# Patient Record
Sex: Male | Born: 1940
Health system: Southern US, Community
[De-identification: ages and names within clinical notes are randomized; demographics above are authoritative.]

## PROBLEM LIST (undated history)

## (undated) DIAGNOSIS — E785 Hyperlipidemia, unspecified: Secondary | ICD-10-CM

## (undated) DIAGNOSIS — IMO0002 Reserved for concepts with insufficient information to code with codable children: Secondary | ICD-10-CM

## (undated) DIAGNOSIS — C61 Malignant neoplasm of prostate: Secondary | ICD-10-CM

## (undated) DIAGNOSIS — I480 Paroxysmal atrial fibrillation: Secondary | ICD-10-CM

## (undated) DIAGNOSIS — R0789 Other chest pain: Secondary | ICD-10-CM

## (undated) DIAGNOSIS — R000 Tachycardia, unspecified: Secondary | ICD-10-CM

## (undated) DIAGNOSIS — R9430 Abnormal result of cardiovascular function study, unspecified: Secondary | ICD-10-CM

## (undated) HISTORY — DX: Tachycardia, unspecified: R00.0

## (undated) HISTORY — DX: Paroxysmal atrial fibrillation: I48.0

## (undated) HISTORY — PX: PROSTATE SURGERY: SHX751

## (undated) HISTORY — DX: Reserved for concepts with insufficient information to code with codable children: IMO0002

## (undated) HISTORY — DX: Malignant neoplasm of prostate: C61

## (undated) HISTORY — DX: Abnormal result of cardiovascular function study, unspecified: R94.30

## (undated) HISTORY — DX: Hyperlipidemia, unspecified: E78.5

## (undated) HISTORY — DX: Other chest pain: R07.89

---

## 2011-12-06 DIAGNOSIS — G252 Other specified forms of tremor: Secondary | ICD-10-CM | POA: Diagnosis not present

## 2011-12-06 DIAGNOSIS — G250 Essential tremor: Secondary | ICD-10-CM | POA: Diagnosis not present

## 2011-12-16 DIAGNOSIS — E7800 Pure hypercholesterolemia, unspecified: Secondary | ICD-10-CM | POA: Diagnosis not present

## 2011-12-22 DIAGNOSIS — N400 Enlarged prostate without lower urinary tract symptoms: Secondary | ICD-10-CM | POA: Diagnosis not present

## 2011-12-22 DIAGNOSIS — E669 Obesity, unspecified: Secondary | ICD-10-CM | POA: Diagnosis not present

## 2011-12-22 DIAGNOSIS — E7800 Pure hypercholesterolemia, unspecified: Secondary | ICD-10-CM | POA: Diagnosis not present

## 2011-12-22 DIAGNOSIS — C61 Malignant neoplasm of prostate: Secondary | ICD-10-CM | POA: Diagnosis not present

## 2012-03-05 DIAGNOSIS — Z9079 Acquired absence of other genital organ(s): Secondary | ICD-10-CM | POA: Diagnosis not present

## 2012-03-05 DIAGNOSIS — R32 Unspecified urinary incontinence: Secondary | ICD-10-CM | POA: Diagnosis not present

## 2012-03-05 DIAGNOSIS — N529 Male erectile dysfunction, unspecified: Secondary | ICD-10-CM | POA: Diagnosis not present

## 2012-03-05 DIAGNOSIS — Z09 Encounter for follow-up examination after completed treatment for conditions other than malignant neoplasm: Secondary | ICD-10-CM | POA: Diagnosis not present

## 2012-03-05 DIAGNOSIS — Z8546 Personal history of malignant neoplasm of prostate: Secondary | ICD-10-CM | POA: Diagnosis not present

## 2012-03-05 DIAGNOSIS — C61 Malignant neoplasm of prostate: Secondary | ICD-10-CM | POA: Diagnosis not present

## 2012-04-08 ENCOUNTER — Other Ambulatory Visit: Payer: Self-pay

## 2012-04-08 DIAGNOSIS — Z8249 Family history of ischemic heart disease and other diseases of the circulatory system: Secondary | ICD-10-CM | POA: Diagnosis not present

## 2012-04-08 DIAGNOSIS — E785 Hyperlipidemia, unspecified: Secondary | ICD-10-CM | POA: Diagnosis not present

## 2012-04-08 DIAGNOSIS — R072 Precordial pain: Secondary | ICD-10-CM | POA: Diagnosis not present

## 2012-04-08 DIAGNOSIS — Z7982 Long term (current) use of aspirin: Secondary | ICD-10-CM | POA: Diagnosis not present

## 2012-04-08 DIAGNOSIS — I4891 Unspecified atrial fibrillation: Secondary | ICD-10-CM | POA: Diagnosis not present

## 2012-04-08 DIAGNOSIS — R079 Chest pain, unspecified: Secondary | ICD-10-CM | POA: Diagnosis not present

## 2012-04-08 DIAGNOSIS — Z8546 Personal history of malignant neoplasm of prostate: Secondary | ICD-10-CM | POA: Diagnosis not present

## 2012-04-08 DIAGNOSIS — R0789 Other chest pain: Secondary | ICD-10-CM | POA: Diagnosis not present

## 2012-04-10 ENCOUNTER — Encounter: Payer: Self-pay | Admitting: Cardiology

## 2012-04-10 DIAGNOSIS — I4891 Unspecified atrial fibrillation: Secondary | ICD-10-CM

## 2012-04-10 DIAGNOSIS — R079 Chest pain, unspecified: Secondary | ICD-10-CM

## 2012-04-16 ENCOUNTER — Encounter: Payer: Self-pay | Admitting: Cardiology

## 2012-04-16 DIAGNOSIS — R079 Chest pain, unspecified: Secondary | ICD-10-CM

## 2012-04-20 DIAGNOSIS — E669 Obesity, unspecified: Secondary | ICD-10-CM | POA: Diagnosis not present

## 2012-04-20 DIAGNOSIS — E7800 Pure hypercholesterolemia, unspecified: Secondary | ICD-10-CM | POA: Diagnosis not present

## 2012-04-23 ENCOUNTER — Encounter: Payer: Self-pay | Admitting: *Deleted

## 2012-04-26 DIAGNOSIS — I4891 Unspecified atrial fibrillation: Secondary | ICD-10-CM | POA: Diagnosis not present

## 2012-05-23 ENCOUNTER — Encounter: Payer: Self-pay | Admitting: Cardiology

## 2012-06-05 ENCOUNTER — Encounter: Payer: Medicare Other | Admitting: Cardiology

## 2012-06-06 ENCOUNTER — Encounter: Payer: Self-pay | Admitting: Adult Health

## 2012-06-06 ENCOUNTER — Ambulatory Visit (INDEPENDENT_AMBULATORY_CARE_PROVIDER_SITE_OTHER): Payer: Medicare Other | Admitting: Adult Health

## 2012-06-06 VITALS — BP 128/84 | HR 65 | Ht 71.0 in | Wt 226.0 lb

## 2012-06-06 DIAGNOSIS — I4891 Unspecified atrial fibrillation: Secondary | ICD-10-CM | POA: Diagnosis not present

## 2012-06-06 DIAGNOSIS — Z8679 Personal history of other diseases of the circulatory system: Secondary | ICD-10-CM

## 2012-06-06 NOTE — Assessment & Plan Note (Signed)
He is doing well. Rate is well controlled. He is not on anticoagulation as he remains in NSR with diltiazem. He had not any recurrence of the rapid HR. He is complaining of fatigue on the diltiazem and wishes to stop it only using the low dose for "breakthrough" rapid HR. I will defer this to Dr. Andee Lineman as he is otherwise asymptomatic with the exception of the fatigue. He is advised to continue the medication until follow up.   Stress test was negative for ischemia on 04/16/2012.  Echo showed normal LVEF, with mild LVH EF of 60%.

## 2012-06-06 NOTE — Progress Notes (Signed)
   HPI: Mr. Noah Mccarthy is a very pleasant 71 y/o patient of Dr. Andee Lineman we are seeing on hospital follow-up. He was admitted to [facility] last month with complaints of chest tightness, shortness of breath. He presented to ER and was found to be in afib with RVR.  Notes from Dr. Andee Lineman are not available at the time of this patients office visit.  However, test results for stress echo and plain echo are available to review. He states he is feeling better, but is often very fatigued with the institution of the Cardizem. He has not had to take any extra doses of Cardizem  For breakthrough HR elevations. He remains active and is traveling to [place] where he has a home, frequently. He denies chest discomfort or DOE.   No Known Allergies  Current Outpatient Prescriptions  Medication Sig Dispense Refill  . aspirin 81 MG tablet Take 81 mg by mouth daily.      . Cholecalciferol (VITAMIN D3) 1000 UNITS CAPS Take 1,000 Units by mouth daily.      Marland Kitchen diltiazem (CARDIZEM) 30 MG tablet Take 30 mg by mouth as needed. Use no more than 2 pills in 24 hours for rapid heart rate.      . diltiazem (DILACOR XR) 120 MG 24 hr capsule Take 120 mg by mouth daily.      . Omega-3 Fatty Acids (FISH OIL) 1000 MG CAPS Take 3,000 mg by mouth daily.      . vitamin C (ASCORBIC ACID) 500 MG tablet Take 500 mg by mouth daily.      Marland Kitchen DISCONTD: DILTIAZEM HCL PO Take 1 capsule by mouth daily.        Past Medical History  Diagnosis Date  . Paroxysmal atrial fibrillation     resloved  . Hyperlipidemia   . Prostate cancer     Past Surgical History  Procedure Date  . Prostate surgery     For prostate cancer    AVW:UJWJXB of systems complete and found to be negative unless listed above PHYSICAL EXAM BP 128/84  Pulse 65  Ht 5\' 11"  (1.803 m)  Wt 226 lb (102.513 kg)  BMI 31.52 kg/m2  General: Well developed, well nourished, in no acute distress Head: Eyes PERRLA, No xanthomas.   Normal cephalic and atramatic  Lungs: Clear  bilaterally to auscultation and percussion. Heart: HRRR S1 S2, without MRG.  Pulses are 2+ & equal.            No carotid bruit. No JVD.  No abdominal bruits. No femoral bruits. Abdomen: Bowel sounds are positive, abdomen soft and non-tender without masses or                  Hernia's noted. Msk:  Back normal, normal gait. Normal strength and tone for age. Extremities: No clubbing, cyanosis or edema.  DP +1 Neuro: Alert and oriented X 3. Psych:  Good affect, responds appropriately  EKG:NSR rate of 65 bpm  ASSESSMENT AND PLAN

## 2012-06-06 NOTE — Patient Instructions (Addendum)
Your physician recommends that you continue on your current medications as directed. Please refer to the Current Medication list given to you today.  Your physician recommends that you schedule a follow-up appointment in: 6 weeks with Dr. Andee Lineman

## 2012-06-11 NOTE — Addendum Note (Signed)
Addended by: Reather Laurence A on: 06/11/2012 11:32 AM   Modules accepted: Orders

## 2012-06-29 ENCOUNTER — Other Ambulatory Visit: Payer: Self-pay | Admitting: Cardiology

## 2012-07-03 DIAGNOSIS — L03039 Cellulitis of unspecified toe: Secondary | ICD-10-CM | POA: Diagnosis not present

## 2012-07-24 DIAGNOSIS — L03039 Cellulitis of unspecified toe: Secondary | ICD-10-CM | POA: Diagnosis not present

## 2012-08-01 ENCOUNTER — Encounter: Payer: Self-pay | Admitting: Cardiology

## 2012-08-01 ENCOUNTER — Ambulatory Visit (INDEPENDENT_AMBULATORY_CARE_PROVIDER_SITE_OTHER): Payer: Medicare Other | Admitting: Cardiology

## 2012-08-01 VITALS — BP 112/77 | HR 92 | Ht 71.0 in | Wt 224.0 lb

## 2012-08-01 DIAGNOSIS — R0789 Other chest pain: Secondary | ICD-10-CM | POA: Insufficient documentation

## 2012-08-01 DIAGNOSIS — R9430 Abnormal result of cardiovascular function study, unspecified: Secondary | ICD-10-CM | POA: Insufficient documentation

## 2012-08-01 DIAGNOSIS — I4891 Unspecified atrial fibrillation: Secondary | ICD-10-CM | POA: Diagnosis not present

## 2012-08-01 DIAGNOSIS — E785 Hyperlipidemia, unspecified: Secondary | ICD-10-CM | POA: Insufficient documentation

## 2012-08-01 DIAGNOSIS — C61 Malignant neoplasm of prostate: Secondary | ICD-10-CM | POA: Insufficient documentation

## 2012-08-01 DIAGNOSIS — I480 Paroxysmal atrial fibrillation: Secondary | ICD-10-CM | POA: Insufficient documentation

## 2012-08-01 NOTE — Progress Notes (Signed)
HPI   The patient is seen today to followup atrial fibrillation. I have never seen this patient before. He was seen by our team at [facility] with rapid atrial fibrillation. After that he was seen in our [place] office. Plans were then made for followup back in our office. As part of today's evaluation I have reviewed the data from the [facility] hospital admission including consultation, discharge summary, 2-D echo, and stress echo reports.  The patient is stable. Initially he presented with some chest discomfort. He had rapid atrial fibrillation at that time. He could not feel his rapid heartbeat. He spontaneously converted to sinus rhythm. He did not require anticoagulation. His echo showed normal LV function. His stress echo revealed no definite ischemia. He was sent home on a low dose of long-acting diltiazem.  Since being at home the patient has not noticed any significant palpitations or chest pain. He thinks that the long-acting diltiazem makes him feel fatigued.  No Known Allergies  Current Outpatient Prescriptions  Medication Sig Dispense Refill  . aspirin 81 MG tablet Take 81 mg by mouth daily.      . Cholecalciferol (VITAMIN D3) 1000 UNITS CAPS Take 1,000 Units by mouth daily.      Marland Kitchen diltiazem (CARDIZEM) 30 MG tablet Take 30 mg by mouth as needed. Use no more than 2 pills in 24 hours for rapid heart rate.      . rosuvastatin (CRESTOR) 5 MG tablet Take 5 mg by mouth daily.      . vitamin C (ASCORBIC ACID) 500 MG tablet Take 500 mg by mouth daily.      Marland Kitchen DISCONTD: diltiazem (DILACOR XR) 120 MG 24 hr capsule Take 120 mg by mouth daily.        History   Social History  . Marital Status: Unknown    Spouse Name: N/A    Number of Children: N/A  . Years of Education: N/A   Occupational History  . Not on file.   Social History Main Topics  . Smoking status: Never Smoker   . Smokeless tobacco: Never Used  . Alcohol Use: Yes     1 beverage every night for dinner  .  Drug Use: No  . Sexually Active: Not on file   Other Topics Concern  . Not on file   Social History Narrative  . No narrative on file    Family History  Problem Relation Age of Onset  . Heart attack Brother 60    cause of death    Past Medical History  Diagnosis Date  . Paroxysmal atrial fibrillation     Hospitalization in June, 2013, spontaneous conversion to normal sinus rhythm, CHADS score is 0, therefore anticoagulation has not been recommended  . Hyperlipidemia   . Prostate cancer   . Ejection fraction     EF 60%, echo, June, 2013  . Chest tightness     With rapid atrial fibrillation, June, 2013, stress echo normal    Past Surgical History  Procedure Date  . Prostate surgery     For prostate cancer    ROS   Patient denies fever, chills, headache, sweats, rash, change in vision, change in hearing, chest pain, cough, nausea vomiting, urinary symptoms. All other systems are reviewed and are negative.  PHYSICAL EXAM  Patient is oriented to person time and place. Affect is normal. Lungs are clear. Respiratory effort is nonlabored. There is no jugulovenous distention. Cardiac exam reveals S1 and S2. There no clicks or  significant murmurs. The abdomen is soft. There is no peripheral edema. There no musculoskeletal deformities. There are no skin rashes.  Filed Vitals:   08/01/12 1342  BP: 112/77  Pulse: 92  Height: 5\' 11"  (1.803 m)  Weight: 224 lb (101.606 kg)     ASSESSMENT & PLAN

## 2012-08-01 NOTE — Patient Instructions (Addendum)
Your physician recommends that you schedule a follow-up appointment in: 3 months. Your physician has recommended you make the following change in your medication: Stop dilitazem 120 mg. All other medications will remain the same.

## 2012-08-01 NOTE — Assessment & Plan Note (Signed)
The patient has not had any recurrent chest tightness. This occurred in the setting of rapid atrial fibrillation. His stress study showed no sign of ischemia or scar. No further workup.

## 2012-08-01 NOTE — Assessment & Plan Note (Signed)
The patient has had one episode of documented atrial fibrillation. He does not require anticoagulation. Unfortunately he did not feel the rapid heartbeat. It may be difficult to know if he is having further atrial fib. He does not require anticoagulation at this time. He thinks that long-acting diltiazem makes him feel poorly. At this time there is no absolute reason to continue it. He will carry short acting diltiazem with him to use on a when necessary basis. If he has increased problems he will be in touch. I will see him back in 3 months to review review his overall status.

## 2012-08-06 DIAGNOSIS — E7800 Pure hypercholesterolemia, unspecified: Secondary | ICD-10-CM | POA: Diagnosis not present

## 2012-08-20 ENCOUNTER — Ambulatory Visit: Payer: Medicare Other | Admitting: Cardiology

## 2012-08-30 DIAGNOSIS — R5381 Other malaise: Secondary | ICD-10-CM | POA: Diagnosis not present

## 2012-08-30 DIAGNOSIS — I4891 Unspecified atrial fibrillation: Secondary | ICD-10-CM | POA: Diagnosis not present

## 2012-08-30 DIAGNOSIS — E669 Obesity, unspecified: Secondary | ICD-10-CM | POA: Diagnosis not present

## 2012-08-30 DIAGNOSIS — C61 Malignant neoplasm of prostate: Secondary | ICD-10-CM | POA: Diagnosis not present

## 2012-08-30 DIAGNOSIS — N400 Enlarged prostate without lower urinary tract symptoms: Secondary | ICD-10-CM | POA: Diagnosis not present

## 2012-08-30 DIAGNOSIS — E7800 Pure hypercholesterolemia, unspecified: Secondary | ICD-10-CM | POA: Diagnosis not present

## 2012-10-24 DIAGNOSIS — H35369 Drusen (degenerative) of macula, unspecified eye: Secondary | ICD-10-CM | POA: Diagnosis not present

## 2012-10-24 DIAGNOSIS — H53009 Unspecified amblyopia, unspecified eye: Secondary | ICD-10-CM | POA: Diagnosis not present

## 2012-10-24 DIAGNOSIS — H2510 Age-related nuclear cataract, unspecified eye: Secondary | ICD-10-CM | POA: Diagnosis not present

## 2012-10-24 DIAGNOSIS — H40019 Open angle with borderline findings, low risk, unspecified eye: Secondary | ICD-10-CM | POA: Diagnosis not present

## 2012-10-26 DIAGNOSIS — C61 Malignant neoplasm of prostate: Secondary | ICD-10-CM | POA: Diagnosis not present

## 2012-10-30 ENCOUNTER — Encounter: Payer: Self-pay | Admitting: Cardiology

## 2012-10-30 ENCOUNTER — Ambulatory Visit (INDEPENDENT_AMBULATORY_CARE_PROVIDER_SITE_OTHER): Payer: Medicare Other | Admitting: Cardiology

## 2012-10-30 VITALS — BP 116/79 | HR 111 | Ht 71.0 in | Wt 232.0 lb

## 2012-10-30 DIAGNOSIS — R000 Tachycardia, unspecified: Secondary | ICD-10-CM | POA: Insufficient documentation

## 2012-10-30 DIAGNOSIS — R0789 Other chest pain: Secondary | ICD-10-CM

## 2012-10-30 DIAGNOSIS — R0989 Other specified symptoms and signs involving the circulatory and respiratory systems: Secondary | ICD-10-CM

## 2012-10-30 DIAGNOSIS — I4891 Unspecified atrial fibrillation: Secondary | ICD-10-CM | POA: Diagnosis not present

## 2012-10-30 DIAGNOSIS — I498 Other specified cardiac arrhythmias: Secondary | ICD-10-CM | POA: Diagnosis not present

## 2012-10-30 DIAGNOSIS — I480 Paroxysmal atrial fibrillation: Secondary | ICD-10-CM

## 2012-10-30 NOTE — Assessment & Plan Note (Signed)
Patient has mild sinus tachycardia in the office today. In his last recent visit with another physician his heart rate was normal. He did work hard this morning although he did Heat lunch.. He drinks caffeine in the morning. I show to manage check his pulse. He'll check his pulse at random times over the next several days and call this in to [place]. I suspect that his resting heart rate will be fine. If he has persistent sinus tachycardia I will probably recommend low-dose beta-blockade. I may consider further workup. We know that his echo was normal in June, 2013.

## 2012-10-30 NOTE — Assessment & Plan Note (Signed)
There has been no clinical recurrence of paroxysmal atrial fibrillation. His chads score is low. No further workup.

## 2012-10-30 NOTE — Progress Notes (Signed)
   HPI  Patient is seen to followup an episode of atrial fibrillation. This happened on one occurrence. He is completely stable. He chopped wood this morning. He has no symptoms.  No Known Allergies  Current Outpatient Prescriptions  Medication Sig Dispense Refill  . aspirin 81 MG tablet Take 81 mg by mouth daily.      . Cholecalciferol (VITAMIN D3) 1000 UNITS CAPS Take 1,000 Units by mouth daily.      Marland Kitchen diltiazem (CARDIZEM) 30 MG tablet Take 30 mg by mouth as needed. Use no more than 2 pills in 24 hours for rapid heart rate.      . rosuvastatin (CRESTOR) 5 MG tablet Take 2.5 mg by mouth daily.       . vitamin C (ASCORBIC ACID) 500 MG tablet Take 500 mg by mouth daily.        History   Social History  . Marital Status: Unknown    Spouse Name: N/A    Number of Children: N/A  . Years of Education: N/A   Occupational History  . Not on file.   Social History Main Topics  . Smoking status: Never Smoker   . Smokeless tobacco: Never Used  . Alcohol Use: Yes     Comment: 1 beverage every night for dinner  . Drug Use: No  . Sexually Active: Not on file   Other Topics Concern  . Not on file   Social History Narrative  . No narrative on file    Family History  Problem Relation Age of Onset  . Heart attack Brother 60    cause of death    Past Medical History  Diagnosis Date  . Paroxysmal atrial fibrillation     Hospitalization in June, 2013, spontaneous conversion to normal sinus rhythm, CHADS score is 0, therefore anticoagulation has not been recommended  . Hyperlipidemia   . Prostate cancer   . Ejection fraction     EF 60%, echo, June, 2013  . Chest tightness     With rapid atrial fibrillation, June, 2013, stress echo normal    Past Surgical History  Procedure Date  . Prostate surgery     For prostate cancer    Patient Active Problem List  Diagnosis  . Hyperlipidemia  . Prostate cancer  . Ejection fraction  . Chest tightness  . Paroxysmal atrial  fibrillation    ROS   Patient denies fever, chills, headache, sweats, rash, change in vision, change in hearing, chest pain, cough, nausea vomiting, urinary symptoms. All other systems are reviewed and are negative.  PHYSICAL EXAM  Patient is oriented to person time and place. Affect is normal. There is no jugular venous distention. Lungs are clear. Respiratory effort is nonlabored. Cardiac exam reveals S1 and S2. There no clicks or significant murmurs. The abdomen is soft. There is no peripheral edema. The patient's heart rate is increased at rest at approximately 100.  Filed Vitals:   10/30/12 1317  BP: 116/79  Pulse: 111  Height: 5\' 11"  (1.803 m)  Weight: 232 lb (105.235 kg)   EKG is done today and reviewed by me. There is no significant change. There is mild sinus tachycardia.  ASSESSMENT & PLAN

## 2012-10-30 NOTE — Assessment & Plan Note (Signed)
There is been no recurrent chest pain. He had initially was rapid atrial fibrillation. No further workup.

## 2012-10-30 NOTE — Patient Instructions (Addendum)
Your physician recommends that you schedule a follow-up appointment in: 6 months. You will receive a reminder letter in the mail in about 4 months reminding you to call and schedule your appointment. If you don't receive this letter, please contact our office.  Your physician recommends that you continue on your current medications as directed. Please refer to the Current Medication list given to you today.  Please call our office with your pulse results next week.

## 2012-11-08 ENCOUNTER — Telehealth: Payer: Self-pay | Admitting: Cardiology

## 2012-11-08 NOTE — Telephone Encounter (Signed)
Mr. Guevara called stating that Dr. Myrtis Ser had requested that he call the office with report of his heart rate. States that it is back Normal at this time. He checked it today (12-26) at 9:00am HR was 64.

## 2013-01-03 DIAGNOSIS — E7800 Pure hypercholesterolemia, unspecified: Secondary | ICD-10-CM | POA: Diagnosis not present

## 2013-01-03 DIAGNOSIS — R5381 Other malaise: Secondary | ICD-10-CM | POA: Diagnosis not present

## 2013-01-09 DIAGNOSIS — N400 Enlarged prostate without lower urinary tract symptoms: Secondary | ICD-10-CM | POA: Diagnosis not present

## 2013-01-09 DIAGNOSIS — E7800 Pure hypercholesterolemia, unspecified: Secondary | ICD-10-CM | POA: Diagnosis not present

## 2013-01-09 DIAGNOSIS — E669 Obesity, unspecified: Secondary | ICD-10-CM | POA: Diagnosis not present

## 2013-01-09 DIAGNOSIS — I4891 Unspecified atrial fibrillation: Secondary | ICD-10-CM | POA: Diagnosis not present

## 2013-01-09 DIAGNOSIS — R5381 Other malaise: Secondary | ICD-10-CM | POA: Diagnosis not present

## 2013-01-09 DIAGNOSIS — C61 Malignant neoplasm of prostate: Secondary | ICD-10-CM | POA: Diagnosis not present

## 2013-03-01 DIAGNOSIS — C61 Malignant neoplasm of prostate: Secondary | ICD-10-CM | POA: Diagnosis not present

## 2013-04-30 ENCOUNTER — Encounter: Payer: Self-pay | Admitting: Cardiology

## 2013-04-30 ENCOUNTER — Ambulatory Visit (INDEPENDENT_AMBULATORY_CARE_PROVIDER_SITE_OTHER): Payer: Medicare Other | Admitting: Cardiology

## 2013-04-30 VITALS — BP 133/84 | HR 76 | Ht 71.0 in | Wt 222.8 lb

## 2013-04-30 DIAGNOSIS — I498 Other specified cardiac arrhythmias: Secondary | ICD-10-CM | POA: Diagnosis not present

## 2013-04-30 DIAGNOSIS — R0789 Other chest pain: Secondary | ICD-10-CM | POA: Diagnosis not present

## 2013-04-30 DIAGNOSIS — R000 Tachycardia, unspecified: Secondary | ICD-10-CM

## 2013-04-30 DIAGNOSIS — E785 Hyperlipidemia, unspecified: Secondary | ICD-10-CM | POA: Diagnosis not present

## 2013-04-30 DIAGNOSIS — I4891 Unspecified atrial fibrillation: Secondary | ICD-10-CM

## 2013-04-30 DIAGNOSIS — I480 Paroxysmal atrial fibrillation: Secondary | ICD-10-CM

## 2013-04-30 NOTE — Assessment & Plan Note (Signed)
Patient has had no return of symptomatic atrial fib. No further workup.

## 2013-04-30 NOTE — Assessment & Plan Note (Signed)
He takes low dose Crestor. We want to see if his fatigue is related to the statin. He will stop his Crestor completely for 2-3 weeks.

## 2013-04-30 NOTE — Assessment & Plan Note (Signed)
He's not having any symptoms. He had the chest tightness when he had rapid atrial fib on one occasion in June, 2013. No further workup is needed.

## 2013-04-30 NOTE — Assessment & Plan Note (Signed)
He is not having any obvious ongoing sinus tachycardia.  We'll see him back in one year.

## 2013-04-30 NOTE — Patient Instructions (Addendum)
Your physician recommends that you schedule a follow-up appointment in: 1 year. You will receive a reminder letter in the mail in about 10 months reminding you to call and schedule your appointment. If you don't receive this letter, please contact our office. Your physician has recommended you make the following change in your medication: HOLD CRESTOR FOR 2-3 WEEKS. PLEASE SEE IF YOUR SYMPTOMS RESOLVE. All other medications will remain the same.

## 2013-04-30 NOTE — Progress Notes (Signed)
HPI  Patient is seen to followup a single episode of atrial fibrillation and some mild sinus tachycardia in the past. He has not had any palpitations. When I saw him last in December, 2013 he was in sinus rhythm. I asked him to call us back after a few weeks to check with his heart rate was at home. He was in the normal range.  He has a problem with fatigue. We are wondering if there is any chance it could be from low dose Crestor.  No Known Allergies  Current Outpatient Prescriptions  Medication Sig Dispense Refill  . aspirin 81 MG tablet Take 81 mg by mouth daily.      . Cholecalciferol (VITAMIN D3) 1000 UNITS CAPS Take 1,000 Units by mouth daily.      Marland Kitchen diltiazem (CARDIZEM) 30 MG tablet Take 30 mg by mouth as needed. Use no more than 2 pills in 24 hours for rapid heart rate.      . rosuvastatin (CRESTOR) 5 MG tablet Take 2.5 mg by mouth every other day.       . vitamin C (ASCORBIC ACID) 500 MG tablet Take 500 mg by mouth daily.       No current facility-administered medications for this visit.    History   Social History  . Marital Status: Unknown    Spouse Name: N/A    Number of Children: N/A  . Years of Education: N/A   Occupational History  . Not on file.   Social History Main Topics  . Smoking status: Never Smoker   . Smokeless tobacco: Never Used  . Alcohol Use: Yes     Comment: 1 beverage every night for dinner  . Drug Use: No  . Sexually Active: Not on file   Other Topics Concern  . Not on file   Social History Narrative  . No narrative on file    Family History  Problem Relation Age of Onset  . Heart attack Brother 60    cause of death    Past Medical History  Diagnosis Date  . Paroxysmal atrial fibrillation     Hospitalization in June, 2013, spontaneous conversion to normal sinus rhythm, CHADS score is 0, therefore anticoagulation has not been recommended  . Hyperlipidemia   . Prostate cancer   . Ejection fraction     EF 60%, echo, June, 2013    . Chest tightness     With rapid atrial fibrillation, June, 2013, stress echo normal  . Sinus tachycardia     Mild, office , December, 2013, no symptoms    Past Surgical History  Procedure Laterality Date  . Prostate surgery      For prostate cancer    Patient Active Problem List   Diagnosis Date Noted  . Sinus tachycardia   . Hyperlipidemia   . Prostate cancer   . Ejection fraction   . Chest tightness   . Paroxysmal atrial fibrillation     ROS   Patient denies fever, chills, headache, sweats, rash, change in vision, change in hearing, chest pain, cough, nausea vomiting, urinary symptoms. All of the systems are reviewed and are negative.  PHYSICAL EXAM  Patient is oriented to person time and place. Affect is normal. There is no jugulovenous distention. Lungs are clear. Respiratory effort is nonlabored. Cardiac exam reveals S1 and S2. There no clicks or significant murmurs. The abdomen is soft. There is no peripheral edema.  Filed Vitals:   04/30/13 1316  BP: 133/84  Pulse:  76  Height: 5\' 11"  (1.803 m)  Weight: 222 lb 12.8 oz (101.061 kg)  SpO2: 96%   EKG is done today and reviewed by me. The heart rate is 81. There is normal sinus rhythm. There is no significant change.  ASSESSMENT & PLAN

## 2013-06-03 DIAGNOSIS — E785 Hyperlipidemia, unspecified: Secondary | ICD-10-CM | POA: Diagnosis not present

## 2013-06-03 DIAGNOSIS — D510 Vitamin B12 deficiency anemia due to intrinsic factor deficiency: Secondary | ICD-10-CM | POA: Diagnosis not present

## 2013-06-03 DIAGNOSIS — I4891 Unspecified atrial fibrillation: Secondary | ICD-10-CM | POA: Diagnosis not present

## 2013-06-03 DIAGNOSIS — R5381 Other malaise: Secondary | ICD-10-CM | POA: Diagnosis not present

## 2013-06-03 DIAGNOSIS — C61 Malignant neoplasm of prostate: Secondary | ICD-10-CM | POA: Diagnosis not present

## 2013-06-05 DIAGNOSIS — D510 Vitamin B12 deficiency anemia due to intrinsic factor deficiency: Secondary | ICD-10-CM | POA: Diagnosis not present

## 2013-06-06 DIAGNOSIS — D510 Vitamin B12 deficiency anemia due to intrinsic factor deficiency: Secondary | ICD-10-CM | POA: Diagnosis not present

## 2013-06-07 DIAGNOSIS — E538 Deficiency of other specified B group vitamins: Secondary | ICD-10-CM | POA: Diagnosis not present

## 2013-06-10 DIAGNOSIS — D510 Vitamin B12 deficiency anemia due to intrinsic factor deficiency: Secondary | ICD-10-CM | POA: Diagnosis not present

## 2013-06-11 DIAGNOSIS — D510 Vitamin B12 deficiency anemia due to intrinsic factor deficiency: Secondary | ICD-10-CM | POA: Diagnosis not present

## 2013-07-01 DIAGNOSIS — I4891 Unspecified atrial fibrillation: Secondary | ICD-10-CM | POA: Diagnosis not present

## 2013-07-01 DIAGNOSIS — E669 Obesity, unspecified: Secondary | ICD-10-CM | POA: Diagnosis not present

## 2013-07-01 DIAGNOSIS — E7800 Pure hypercholesterolemia, unspecified: Secondary | ICD-10-CM | POA: Diagnosis not present

## 2013-07-01 DIAGNOSIS — C61 Malignant neoplasm of prostate: Secondary | ICD-10-CM | POA: Diagnosis not present

## 2013-07-01 DIAGNOSIS — R5381 Other malaise: Secondary | ICD-10-CM | POA: Diagnosis not present

## 2013-07-02 DIAGNOSIS — R5381 Other malaise: Secondary | ICD-10-CM | POA: Diagnosis not present

## 2013-07-02 DIAGNOSIS — D510 Vitamin B12 deficiency anemia due to intrinsic factor deficiency: Secondary | ICD-10-CM | POA: Diagnosis not present

## 2013-07-02 DIAGNOSIS — I4891 Unspecified atrial fibrillation: Secondary | ICD-10-CM | POA: Diagnosis not present

## 2013-07-02 DIAGNOSIS — E7800 Pure hypercholesterolemia, unspecified: Secondary | ICD-10-CM | POA: Diagnosis not present

## 2013-10-29 DIAGNOSIS — C61 Malignant neoplasm of prostate: Secondary | ICD-10-CM | POA: Diagnosis not present

## 2013-11-18 DIAGNOSIS — H2510 Age-related nuclear cataract, unspecified eye: Secondary | ICD-10-CM | POA: Diagnosis not present

## 2013-11-18 DIAGNOSIS — H53009 Unspecified amblyopia, unspecified eye: Secondary | ICD-10-CM | POA: Diagnosis not present

## 2013-11-18 DIAGNOSIS — H40019 Open angle with borderline findings, low risk, unspecified eye: Secondary | ICD-10-CM | POA: Diagnosis not present

## 2013-12-26 DIAGNOSIS — C61 Malignant neoplasm of prostate: Secondary | ICD-10-CM | POA: Diagnosis not present

## 2013-12-26 DIAGNOSIS — N400 Enlarged prostate without lower urinary tract symptoms: Secondary | ICD-10-CM | POA: Diagnosis not present

## 2013-12-26 DIAGNOSIS — R5381 Other malaise: Secondary | ICD-10-CM | POA: Diagnosis not present

## 2013-12-26 DIAGNOSIS — R5383 Other fatigue: Secondary | ICD-10-CM | POA: Diagnosis not present

## 2013-12-26 DIAGNOSIS — E7800 Pure hypercholesterolemia, unspecified: Secondary | ICD-10-CM | POA: Diagnosis not present

## 2013-12-26 DIAGNOSIS — E669 Obesity, unspecified: Secondary | ICD-10-CM | POA: Diagnosis not present

## 2013-12-26 DIAGNOSIS — I4891 Unspecified atrial fibrillation: Secondary | ICD-10-CM | POA: Diagnosis not present

## 2013-12-26 DIAGNOSIS — E538 Deficiency of other specified B group vitamins: Secondary | ICD-10-CM | POA: Diagnosis not present

## 2014-01-01 DIAGNOSIS — E669 Obesity, unspecified: Secondary | ICD-10-CM | POA: Diagnosis not present

## 2014-01-01 DIAGNOSIS — R5381 Other malaise: Secondary | ICD-10-CM | POA: Diagnosis not present

## 2014-01-01 DIAGNOSIS — M545 Low back pain, unspecified: Secondary | ICD-10-CM | POA: Diagnosis not present

## 2014-01-01 DIAGNOSIS — R5383 Other fatigue: Secondary | ICD-10-CM | POA: Diagnosis not present

## 2014-01-01 DIAGNOSIS — I4891 Unspecified atrial fibrillation: Secondary | ICD-10-CM | POA: Diagnosis not present

## 2014-01-01 DIAGNOSIS — E7800 Pure hypercholesterolemia, unspecified: Secondary | ICD-10-CM | POA: Diagnosis not present

## 2014-02-28 DIAGNOSIS — C61 Malignant neoplasm of prostate: Secondary | ICD-10-CM | POA: Diagnosis not present

## 2014-04-28 ENCOUNTER — Ambulatory Visit (INDEPENDENT_AMBULATORY_CARE_PROVIDER_SITE_OTHER): Payer: Medicare Other | Admitting: Cardiology

## 2014-04-28 ENCOUNTER — Encounter: Payer: Self-pay | Admitting: Cardiology

## 2014-04-28 VITALS — BP 133/85 | HR 64 | Ht 71.0 in | Wt 220.1 lb

## 2014-04-28 DIAGNOSIS — I4891 Unspecified atrial fibrillation: Secondary | ICD-10-CM

## 2014-04-28 DIAGNOSIS — R0789 Other chest pain: Secondary | ICD-10-CM | POA: Diagnosis not present

## 2014-04-28 DIAGNOSIS — E785 Hyperlipidemia, unspecified: Secondary | ICD-10-CM

## 2014-04-28 DIAGNOSIS — I498 Other specified cardiac arrhythmias: Secondary | ICD-10-CM

## 2014-04-28 DIAGNOSIS — R000 Tachycardia, unspecified: Secondary | ICD-10-CM

## 2014-04-28 DIAGNOSIS — I480 Paroxysmal atrial fibrillation: Secondary | ICD-10-CM

## 2014-04-28 NOTE — Assessment & Plan Note (Signed)
His risk score is 0. He has very rare palpitations. No further workup at this time.

## 2014-04-28 NOTE — Assessment & Plan Note (Signed)
He is had no recurrent chest pain. His only chest pain in the past was when he had rapid atrial fibrillation. No further workup.

## 2014-04-28 NOTE — Patient Instructions (Signed)

## 2014-04-28 NOTE — Assessment & Plan Note (Signed)
Patient is being treated with low-dose Crestor.

## 2014-04-28 NOTE — Progress Notes (Signed)
Patient ID: Noah Mccarthy, male   DOB: 1940-12-22, 73 y.o.   MRN: [id number]    HPI  Patient is seen to followup a history of paroxysmal atrial fibrillation. I saw him one year ago. He has done well. He has rare palpitations. He did have a single documented episode of atrial fibrillation. He has a low risk score. Anticoagulation has not been recommended yet.  No Known Allergies  Current Outpatient Prescriptions  Medication Sig Dispense Refill  . aspirin 81 MG tablet Take 81 mg by mouth daily.      . Cholecalciferol (VITAMIN D3) 1000 UNITS CAPS Take 1,000 Units by mouth daily.      . Cyanocobalamin (VITAMIN B-12 IJ) Inject 1 mL as directed every 30 (thirty) days.      Marland Kitchen diltiazem (CARDIZEM) 30 MG tablet Take 30 mg by mouth as needed. Use no more than 2 pills in 24 hours for rapid heart rate.      . rosuvastatin (CRESTOR) 5 MG tablet Take 2.5 mg by mouth every other day.       . vitamin C (ASCORBIC ACID) 500 MG tablet Take 500 mg by mouth daily.       No current facility-administered medications for this visit.    History   Social History  . Marital Status: Unknown    Spouse Name: N/A    Number of Children: N/A  . Years of Education: N/A   Occupational History  . Not on file.   Social History Main Topics  . Smoking status: Never Smoker   . Smokeless tobacco: Never Used  . Alcohol Use: Yes     Comment: 1 beverage every night for dinner  . Drug Use: No  . Sexual Activity: Not on file   Other Topics Concern  . Not on file   Social History Narrative  . No narrative on file    Family History  Problem Relation Age of Onset  . Heart attack Brother 60    cause of death    Past Medical History  Diagnosis Date  . Paroxysmal atrial fibrillation     Hospitalization in June, 2013, spontaneous conversion to normal sinus rhythm, CHADS score is 0, therefore anticoagulation has not been recommended  . Hyperlipidemia   . Prostate cancer   . Ejection fraction     EF 60%, echo,  June, 2013  . Chest tightness     With rapid atrial fibrillation, June, 2013, stress echo normal  . Sinus tachycardia     Mild, office , December, 2013, no symptoms    Past Surgical History  Procedure Laterality Date  . Prostate surgery      For prostate cancer    Patient Active Problem List   Diagnosis Date Noted  . Sinus tachycardia   . Hyperlipidemia   . Prostate cancer   . Ejection fraction   . Chest tightness   . Paroxysmal atrial fibrillation     ROS   Patient denies fever, chills, headache, sweats, rash, change in vision, change in hearing, chest pain, cough, nausea or vomiting, urinary symptoms. All other systems are reviewed and are negative.  PHYSICAL EXAM   Patient is oriented to person time and place. Affect is normal. Head is atraumatic. Sclera and conjunctiva are normal. There is no jugulovenous distention. Lungs are clear. Respiratory effort is nonlabored. Cardiac exam reveals S1 and S2. The rhythm is regular. The abdomen is soft. There is no peripheral edema. There are no musculoskeletal deformities. There are no  skin rashes.  Filed Vitals:   04/28/14 0847  BP: 133/85  Pulse: 64  Height: 5\' 11"  (1.803 m)  Weight: 220 lb 1.9 oz (99.846 kg)  SpO2: 97%   EKG is done today and reviewed by me. There is normal sinus rhythm. There is no change from the past.  ASSESSMENT & PLAN

## 2014-07-09 DIAGNOSIS — I4891 Unspecified atrial fibrillation: Secondary | ICD-10-CM | POA: Diagnosis not present

## 2014-07-09 DIAGNOSIS — R5381 Other malaise: Secondary | ICD-10-CM | POA: Diagnosis not present

## 2014-07-09 DIAGNOSIS — E7800 Pure hypercholesterolemia, unspecified: Secondary | ICD-10-CM | POA: Diagnosis not present

## 2014-07-09 DIAGNOSIS — C61 Malignant neoplasm of prostate: Secondary | ICD-10-CM | POA: Diagnosis not present

## 2014-07-09 DIAGNOSIS — E669 Obesity, unspecified: Secondary | ICD-10-CM | POA: Diagnosis not present

## 2014-07-16 DIAGNOSIS — E7800 Pure hypercholesterolemia, unspecified: Secondary | ICD-10-CM | POA: Diagnosis not present

## 2014-07-16 DIAGNOSIS — R5383 Other fatigue: Secondary | ICD-10-CM | POA: Diagnosis not present

## 2014-07-16 DIAGNOSIS — R5381 Other malaise: Secondary | ICD-10-CM | POA: Diagnosis not present

## 2014-07-16 DIAGNOSIS — Z23 Encounter for immunization: Secondary | ICD-10-CM | POA: Diagnosis not present

## 2014-07-16 DIAGNOSIS — M545 Low back pain, unspecified: Secondary | ICD-10-CM | POA: Diagnosis not present

## 2014-07-16 DIAGNOSIS — R7309 Other abnormal glucose: Secondary | ICD-10-CM | POA: Diagnosis not present

## 2014-07-16 DIAGNOSIS — E669 Obesity, unspecified: Secondary | ICD-10-CM | POA: Diagnosis not present

## 2014-07-16 DIAGNOSIS — E538 Deficiency of other specified B group vitamins: Secondary | ICD-10-CM | POA: Diagnosis not present

## 2014-07-16 DIAGNOSIS — I4891 Unspecified atrial fibrillation: Secondary | ICD-10-CM | POA: Diagnosis not present

## 2014-09-16 DIAGNOSIS — Z8546 Personal history of malignant neoplasm of prostate: Secondary | ICD-10-CM | POA: Diagnosis not present

## 2014-09-16 DIAGNOSIS — M4307 Spondylolysis, lumbosacral region: Secondary | ICD-10-CM | POA: Diagnosis not present

## 2014-09-16 DIAGNOSIS — M5136 Other intervertebral disc degeneration, lumbar region: Secondary | ICD-10-CM | POA: Diagnosis not present

## 2014-09-16 DIAGNOSIS — M545 Low back pain: Secondary | ICD-10-CM | POA: Diagnosis not present

## 2014-09-16 DIAGNOSIS — M47816 Spondylosis without myelopathy or radiculopathy, lumbar region: Secondary | ICD-10-CM | POA: Diagnosis not present

## 2014-09-16 DIAGNOSIS — M4313 Spondylolisthesis, cervicothoracic region: Secondary | ICD-10-CM | POA: Diagnosis not present

## 2014-09-16 DIAGNOSIS — M5137 Other intervertebral disc degeneration, lumbosacral region: Secondary | ICD-10-CM | POA: Diagnosis not present

## 2014-10-28 DIAGNOSIS — C61 Malignant neoplasm of prostate: Secondary | ICD-10-CM | POA: Diagnosis not present

## 2014-11-18 DIAGNOSIS — E538 Deficiency of other specified B group vitamins: Secondary | ICD-10-CM | POA: Diagnosis not present

## 2014-11-18 DIAGNOSIS — Z8489 Family history of other specified conditions: Secondary | ICD-10-CM | POA: Diagnosis not present

## 2014-11-18 DIAGNOSIS — Z8546 Personal history of malignant neoplasm of prostate: Secondary | ICD-10-CM | POA: Diagnosis not present

## 2014-11-18 DIAGNOSIS — K644 Residual hemorrhoidal skin tags: Secondary | ICD-10-CM | POA: Diagnosis not present

## 2014-11-18 DIAGNOSIS — Z1211 Encounter for screening for malignant neoplasm of colon: Secondary | ICD-10-CM | POA: Diagnosis not present

## 2014-11-18 DIAGNOSIS — Z801 Family history of malignant neoplasm of trachea, bronchus and lung: Secondary | ICD-10-CM | POA: Diagnosis not present

## 2014-11-18 DIAGNOSIS — Z800 Family history of malignant neoplasm of digestive organs: Secondary | ICD-10-CM | POA: Diagnosis not present

## 2014-11-18 DIAGNOSIS — K5730 Diverticulosis of large intestine without perforation or abscess without bleeding: Secondary | ICD-10-CM | POA: Diagnosis not present

## 2014-11-18 DIAGNOSIS — Z9852 Vasectomy status: Secondary | ICD-10-CM | POA: Diagnosis not present

## 2014-11-18 DIAGNOSIS — Z7982 Long term (current) use of aspirin: Secondary | ICD-10-CM | POA: Diagnosis not present

## 2014-11-18 DIAGNOSIS — E785 Hyperlipidemia, unspecified: Secondary | ICD-10-CM | POA: Diagnosis not present

## 2014-11-18 DIAGNOSIS — Z79899 Other long term (current) drug therapy: Secondary | ICD-10-CM | POA: Diagnosis not present

## 2014-11-18 DIAGNOSIS — I4891 Unspecified atrial fibrillation: Secondary | ICD-10-CM | POA: Diagnosis not present

## 2014-11-24 DIAGNOSIS — H2513 Age-related nuclear cataract, bilateral: Secondary | ICD-10-CM | POA: Diagnosis not present

## 2014-11-24 DIAGNOSIS — H35363 Drusen (degenerative) of macula, bilateral: Secondary | ICD-10-CM | POA: Diagnosis not present

## 2014-11-24 DIAGNOSIS — H40013 Open angle with borderline findings, low risk, bilateral: Secondary | ICD-10-CM | POA: Diagnosis not present

## 2014-11-24 DIAGNOSIS — H53022 Refractive amblyopia, left eye: Secondary | ICD-10-CM | POA: Diagnosis not present

## 2014-12-30 DIAGNOSIS — D225 Melanocytic nevi of trunk: Secondary | ICD-10-CM | POA: Diagnosis not present

## 2014-12-30 DIAGNOSIS — L820 Inflamed seborrheic keratosis: Secondary | ICD-10-CM | POA: Diagnosis not present

## 2014-12-30 DIAGNOSIS — L219 Seborrheic dermatitis, unspecified: Secondary | ICD-10-CM | POA: Diagnosis not present

## 2014-12-30 DIAGNOSIS — D485 Neoplasm of uncertain behavior of skin: Secondary | ICD-10-CM | POA: Diagnosis not present

## 2015-01-06 DIAGNOSIS — E785 Hyperlipidemia, unspecified: Secondary | ICD-10-CM | POA: Diagnosis not present

## 2015-01-06 DIAGNOSIS — E780 Pure hypercholesterolemia: Secondary | ICD-10-CM | POA: Diagnosis not present

## 2015-01-06 DIAGNOSIS — I482 Chronic atrial fibrillation: Secondary | ICD-10-CM | POA: Diagnosis not present

## 2015-01-06 DIAGNOSIS — E539 Vitamin B deficiency, unspecified: Secondary | ICD-10-CM | POA: Diagnosis not present

## 2015-01-06 DIAGNOSIS — R5383 Other fatigue: Secondary | ICD-10-CM | POA: Diagnosis not present

## 2015-01-06 DIAGNOSIS — R739 Hyperglycemia, unspecified: Secondary | ICD-10-CM | POA: Diagnosis not present

## 2015-01-13 DIAGNOSIS — Z1389 Encounter for screening for other disorder: Secondary | ICD-10-CM | POA: Diagnosis not present

## 2015-01-13 DIAGNOSIS — E780 Pure hypercholesterolemia: Secondary | ICD-10-CM | POA: Diagnosis not present

## 2015-01-13 DIAGNOSIS — E539 Vitamin B deficiency, unspecified: Secondary | ICD-10-CM | POA: Diagnosis not present

## 2015-01-13 DIAGNOSIS — R739 Hyperglycemia, unspecified: Secondary | ICD-10-CM | POA: Diagnosis not present

## 2015-01-13 DIAGNOSIS — I482 Chronic atrial fibrillation: Secondary | ICD-10-CM | POA: Diagnosis not present

## 2015-03-16 ENCOUNTER — Encounter: Payer: Self-pay | Admitting: Cardiology

## 2015-03-16 ENCOUNTER — Ambulatory Visit (INDEPENDENT_AMBULATORY_CARE_PROVIDER_SITE_OTHER): Payer: Medicare Other | Admitting: Cardiology

## 2015-03-16 VITALS — BP 126/84 | HR 75 | Ht 71.0 in | Wt 224.8 lb

## 2015-03-16 DIAGNOSIS — R0989 Other specified symptoms and signs involving the circulatory and respiratory systems: Secondary | ICD-10-CM | POA: Diagnosis not present

## 2015-03-16 DIAGNOSIS — I480 Paroxysmal atrial fibrillation: Secondary | ICD-10-CM

## 2015-03-16 DIAGNOSIS — R9430 Abnormal result of cardiovascular function study, unspecified: Secondary | ICD-10-CM

## 2015-03-16 DIAGNOSIS — R0789 Other chest pain: Secondary | ICD-10-CM | POA: Diagnosis not present

## 2015-03-16 NOTE — Progress Notes (Signed)
Cardiology Office Note   Date:  03/16/2015   ID:  Noah Mccarthy, DOB [birth year], MRN [id number]  PCP:  Manon Hilding, MD  Cardiologist:  Dola Argyle, MD   Chief Complaint  Patient presents with  . Appointment    Follow-up paroxysmal atrial fibrillation      History of Present Illness: Noah Mccarthy is a 74 y.o. male who presents today to follow-up a history of an episode of atrial fibrillation in the past. He is very active. He had a single documented episode of atrial fib in the past. His risk score has been low. Anticoagulation has not been recommended in the past. At age 72 his CHADS-VASc score is 1. Historically he has been on aspirin as recommended by his primary physician many years ago. He is not having any significant palpitations. He has very rare fleeting chest tightness that occurs for one to 2 seconds and disappears as soon as he coughs.    Past Medical History  Diagnosis Date  . Paroxysmal atrial fibrillation     Hospitalization in June, 2013, spontaneous conversion to normal sinus rhythm, CHADS score is 0, therefore anticoagulation has not been recommended  . Hyperlipidemia   . Prostate cancer   . Ejection fraction     EF 60%, echo, June, 2013  . Chest tightness     With rapid atrial fibrillation, June, 2013, stress echo normal  . Sinus tachycardia     [facility], office , December, 2013, no symptoms    Past Surgical History  Procedure Laterality Date  . Prostate surgery      For prostate cancer    Patient Active Problem List   Diagnosis Date Noted  . Sinus tachycardia   . Hyperlipidemia   . Prostate cancer   . Ejection fraction   . Chest tightness   . Paroxysmal atrial fibrillation       Current Outpatient Prescriptions  Medication Sig Dispense Refill  . aspirin 81 MG tablet Take 81 mg by mouth daily.    . Cholecalciferol (VITAMIN D3) 1000 UNITS CAPS Take 1,000 Units by mouth daily.    . Cyanocobalamin (VITAMIN B-12 IJ) Inject 1 mL as directed  every 30 (thirty) days.    Marland Kitchen diltiazem (CARDIZEM) 30 MG tablet Take 30 mg by mouth as needed. Use no more than 2 pills in 24 hours for rapid heart rate.    . rosuvastatin (CRESTOR) 5 MG tablet Take 2.5 mg by mouth every other day.     . vitamin C (ASCORBIC ACID) 500 MG tablet Take 500 mg by mouth daily.     No current facility-administered medications for this visit.    Allergies:   Review of patient's allergies indicates no known allergies.    Social History:  The patient  reports that he has never smoked. He has never used smokeless tobacco. He reports that he drinks alcohol. He reports that he does not use illicit drugs.   Family History:  The patient's family history includes Heart attack (age of onset: 101) in his brother.    ROS:  Please see the history of present illness.     Patient denies fever, chills, headache, sweats, rash, change in vision, change in hearing, cough, nausea or vomiting, urinary symptoms. All other systems are reviewed and are negative.   PHYSICAL EXAM: VS:  BP 126/84 mmHg  Pulse 75  Ht 5\' 11"  (1.803 m)  Wt 224 lb 12.8 oz (101.969 kg)  BMI 31.37 kg/m2  SpO2 95% ,  Patient is oriented to person time and place. Affect is normal. Head is atraumatic. Sclera and conjunctiva are normal. There is no jugular venous distention. Lungs are clear. Respiratory effort is nonlabored. Cardiac exam reveals S1 and S2. The rhythm is regular. The abdomen is soft. There is no peripheral edema. There are no musculoskeletal deformities. There are no skin rashes. Neurologic is grossly intact.  EKG:   EKG is done today and reviewed by me. There is sinus rhythm. There are no significant changes.   Recent Labs: No results found for requested labs within last 365 days.    Lipid Panel No results found for: CHOL, TRIG, HDL, CHOLHDL, VLDL, LDLCALC, LDLDIRECT    Wt Readings from Last 3 Encounters:  03/16/15 224 lb 12.8 oz (101.969 kg)  04/28/14 220 lb 1.9 oz (99.846 kg)  04/30/13  222 lb 12.8 oz (101.061 kg)      Current medicines are reviewed  The patient understands his medications.     ASSESSMENT AND PLAN:

## 2015-03-16 NOTE — Patient Instructions (Signed)
Your physician recommends that you continue on your current medications as directed. Please refer to the Current Medication list given to you today. Your physician recommends that you schedule a follow-up appointment in: 9 months or February 2017. You will receive a reminder letter in the mail in about 6-7 months reminding you to call and schedule your appointment. If you don't receive this letter, please contact our office.

## 2015-03-16 NOTE — Assessment & Plan Note (Signed)
He has rare very brief fleeting episodes of chest tightness. These disappear immediately with a cough. No further workup is needed.

## 2015-03-16 NOTE — Assessment & Plan Note (Signed)
His ejection fraction is normal by echo 2013.

## 2015-03-16 NOTE — Assessment & Plan Note (Signed)
The patient had one episode with spontaneous conversion in 2013. His risk score is 1. I have discussed the fact with him that the choice about possible anticoagulation is up to me and him. He prefers to not be anticoagulated at this level. I explained to him that aspirin is not recommended any more for the treatment of atrial fibrillation at any level. He has no proven coronary disease. There is no absolute indication for aspirin use. He tells me that he actually does not take his tablet every day anyway. Therefore he will stop taking aspirin at this time. I made it clear to him that going forward there would be further discussion about possible anticoagulation when he turns 76.

## 2015-03-20 DIAGNOSIS — C61 Malignant neoplasm of prostate: Secondary | ICD-10-CM | POA: Diagnosis not present

## 2015-07-23 DIAGNOSIS — L247 Irritant contact dermatitis due to plants, except food: Secondary | ICD-10-CM | POA: Diagnosis not present

## 2015-07-23 DIAGNOSIS — R5383 Other fatigue: Secondary | ICD-10-CM | POA: Diagnosis not present

## 2015-07-23 DIAGNOSIS — E780 Pure hypercholesterolemia: Secondary | ICD-10-CM | POA: Diagnosis not present

## 2015-07-23 DIAGNOSIS — D519 Vitamin B12 deficiency anemia, unspecified: Secondary | ICD-10-CM | POA: Diagnosis not present

## 2015-07-23 DIAGNOSIS — R739 Hyperglycemia, unspecified: Secondary | ICD-10-CM | POA: Diagnosis not present

## 2015-07-23 DIAGNOSIS — E539 Vitamin B deficiency, unspecified: Secondary | ICD-10-CM | POA: Diagnosis not present

## 2015-07-30 DIAGNOSIS — R739 Hyperglycemia, unspecified: Secondary | ICD-10-CM | POA: Diagnosis not present

## 2015-07-30 DIAGNOSIS — E780 Pure hypercholesterolemia: Secondary | ICD-10-CM | POA: Diagnosis not present

## 2015-07-30 DIAGNOSIS — Z8546 Personal history of malignant neoplasm of prostate: Secondary | ICD-10-CM | POA: Diagnosis not present

## 2015-07-30 DIAGNOSIS — I482 Chronic atrial fibrillation: Secondary | ICD-10-CM | POA: Diagnosis not present

## 2015-07-30 DIAGNOSIS — Z23 Encounter for immunization: Secondary | ICD-10-CM | POA: Diagnosis not present

## 2015-07-30 DIAGNOSIS — D519 Vitamin B12 deficiency anemia, unspecified: Secondary | ICD-10-CM | POA: Diagnosis not present

## 2015-08-03 DIAGNOSIS — Z8546 Personal history of malignant neoplasm of prostate: Secondary | ICD-10-CM | POA: Diagnosis not present

## 2015-08-03 DIAGNOSIS — I4891 Unspecified atrial fibrillation: Secondary | ICD-10-CM | POA: Diagnosis not present

## 2015-08-03 DIAGNOSIS — E785 Hyperlipidemia, unspecified: Secondary | ICD-10-CM | POA: Diagnosis not present

## 2015-08-03 DIAGNOSIS — Z7982 Long term (current) use of aspirin: Secondary | ICD-10-CM | POA: Diagnosis not present

## 2015-08-03 DIAGNOSIS — Z79899 Other long term (current) drug therapy: Secondary | ICD-10-CM | POA: Diagnosis not present

## 2015-08-03 DIAGNOSIS — J9811 Atelectasis: Secondary | ICD-10-CM | POA: Diagnosis not present

## 2015-08-04 DIAGNOSIS — I480 Paroxysmal atrial fibrillation: Secondary | ICD-10-CM | POA: Diagnosis not present

## 2015-08-10 ENCOUNTER — Encounter: Payer: Self-pay | Admitting: *Deleted

## 2015-08-12 ENCOUNTER — Ambulatory Visit (INDEPENDENT_AMBULATORY_CARE_PROVIDER_SITE_OTHER): Payer: Medicare Other | Admitting: Cardiology

## 2015-08-12 ENCOUNTER — Encounter: Payer: Self-pay | Admitting: Cardiology

## 2015-08-12 ENCOUNTER — Encounter: Payer: Self-pay | Admitting: *Deleted

## 2015-08-12 VITALS — BP 122/81 | HR 66 | Ht 71.0 in | Wt 221.8 lb

## 2015-08-12 DIAGNOSIS — R0789 Other chest pain: Secondary | ICD-10-CM

## 2015-08-12 DIAGNOSIS — R079 Chest pain, unspecified: Secondary | ICD-10-CM | POA: Diagnosis not present

## 2015-08-12 DIAGNOSIS — I480 Paroxysmal atrial fibrillation: Secondary | ICD-10-CM | POA: Diagnosis not present

## 2015-08-12 MED ORDER — DILTIAZEM HCL 30 MG PO TABS: 60.0000 mg | ORAL_TABLET | ORAL | Status: DC | PRN

## 2015-08-12 NOTE — Assessment & Plan Note (Signed)
The patient had some chest heaviness with rapid atrial fibrillation in June, 2013. He had a stress echo at that time that was normal. He now gives a history of some chest discomfort when walking up a hill after walking on the track outside. This is more concerning for the possibility of exertional angina. I do feel we should follow through with a stress nuclear scan. This is being arranged. He will then be seen back in the office for further evaluation.

## 2015-08-12 NOTE — Assessment & Plan Note (Signed)
The patient has now had a second episode of atrial fibrillation. The episodes remain very infrequent. I feel we do not need to add an anti-arrhythmic. In comparing his CHADS-VAS score, his only risk factor is that he is greater than age 74. The score is 1. I have spoken with him about the pros and cons of anticoagulation at this time. He prefers to not be anticoagulated. He is on aspirin. I have chosen to continue this because of his history today of some exertional chest discomfort. He is aware that at age 59, the guidelines would change and there would be a formal recommendation for anticoagulation. He does carry short acting diltiazem to take for an episode of atrial fib. He had been given a small dose of metoprolol when he was hospitalized recently. He says that this makes him feel draggy. I have discontinued this.

## 2015-08-12 NOTE — Patient Instructions (Addendum)
Your physician has recommended you make the following change in your medication:  Stop metoprolol . Take diltiazem 60 mg as needed for rapid heart rate. Continue all other medications the same. Your physician has requested that you have en exercise stress myoview. For further information please visit [email or web address]. Please follow instruction sheet, as given. Your physician recommends that you schedule a follow-up appointment in: 1 week after stress test with Dr. Harl Bowie.

## 2015-08-12 NOTE — Progress Notes (Signed)
Cardiology Office Note   Date:  08/12/2015   ID:  Noah Mccarthy, DOB 30-Aug-1941, MRN [id number]  PCP:  Manon Hilding, MD  Cardiologist:  Dola Argyle, MD   Chief Complaint  Patient presents with  . Appointment    Follow-up paroxysmal atrial fibrillation      History of Present Illness: Noah Mccarthy is a 74 y.o. male who presents to follow-up atrial fibrillation. I had seen him last in May, 2016. He was stable at that time. In the remote past he had one episode of atrial fib that he felt. At that time the atrial fib scoring method but to a score of 0 and he has not been anticoagulated. Recently he had an episode of atrial fibrillation. He went to the hospital and converted spontaneously with diltiazem. He is now back for follow-up. His CHADS-VAS score is 1.   The patient also mentions today for the first time that he walks on a very regular basis. He is noted that sometimes after walking greater than 2 miles he has to walk up a hill to get back to his car. In that setting, he has had some vague chest discomfort. This has not persisted. It is only in this situation that he has noticed this.    Past Medical History  Diagnosis Date  . Paroxysmal atrial fibrillation     Hospitalization in June, 2013, spontaneous conversion to normal sinus rhythm, CHADS score is 0, therefore anticoagulation has not been recommended  . Hyperlipidemia   . Prostate cancer   . Ejection fraction     EF 60%, echo, June, 2013  . Chest tightness     With rapid atrial fibrillation, June, 2013, stress echo normal  . Sinus tachycardia     [facility], office , December, 2013, no symptoms    Past Surgical History  Procedure Laterality Date  . Prostate surgery      For prostate cancer    Patient Active Problem List   Diagnosis Date Noted  . Sinus tachycardia   . Hyperlipidemia   . Prostate cancer   . Ejection fraction   . Chest tightness   . Paroxysmal atrial fibrillation       Current Outpatient  Prescriptions  Medication Sig Dispense Refill  . aspirin 81 MG tablet Take 81 mg by mouth daily.    Marland Kitchen diltiazem (CARDIZEM) 30 MG tablet Take 2 tablets (60 mg total) by mouth as needed. for rapid heart rate 15 tablet 2  . rosuvastatin (CRESTOR) 5 MG tablet Take 2.5 mg by mouth every other day.     . vitamin B-12 (CYANOCOBALAMIN) 1000 MCG tablet Take 1,000 mcg by mouth daily.    . vitamin C (ASCORBIC ACID) 500 MG tablet Take 500 mg by mouth daily.     No current facility-administered medications for this visit.    Allergies:   Review of patient's allergies indicates no known allergies.    Social History:  The patient  reports that he has never smoked. He has never used smokeless tobacco. He reports that he drinks alcohol. He reports that he does not use illicit drugs.   Family History:  The patient's family history includes Heart attack (age of onset: 23) in his brother.    ROS:  Please see the history of present illness.  Patient denies fever, chills, headache, sweats, rash, change in vision, change in hearing, cough, nausea or vomiting, urinary symptoms. All other systems are reviewed and are negative.  PHYSICAL EXAM: VS:  BP 122/81 mmHg  Pulse 66  Ht 5\' 11"  (1.803 m)  Wt 221 lb 12.8 oz (100.608 kg)  BMI 30.95 kg/m2  SpO2 95% , Patient is oriented to person time and place. Affect is normal. Head is atraumatic. Sclera and conjunctiva are normal. There is no jugular venous distention. Lungs are clear. Respiratory effort is not labored. Cardiac exam reveals S1 and S2. There are no clicks or significant murmurs. The abdomen is soft. There is no peripheral edema. There are no musculoskeletal deformities. There are no skin rashes. The rhythm is regular.   EKG:   EKG is not done today. EKGs had been done when the patient was in the hospital very recently with his atrial fibrillation. EKGs are not available to me. However there is no mention of a diagnostic change.   Recent Labs: No  results found for requested labs within last 365 days.    Lipid Panel No results found for: CHOL, TRIG, HDL, CHOLHDL, VLDL, LDLCALC, LDLDIRECT    Wt Readings from Last 3 Encounters:  08/12/15 221 lb 12.8 oz (100.608 kg)  03/16/15 224 lb 12.8 oz (101.969 kg)  04/28/14 220 lb 1.9 oz (99.846 kg)      Current medicines are reviewed  The patient understands his medications.     ASSESSMENT AND PLAN:

## 2015-09-09 ENCOUNTER — Encounter (HOSPITAL_COMMUNITY)
Admission: RE | Admit: 2015-09-09 | Discharge: 2015-09-09 | Disposition: A | Discharge status: Home / Self Care | Payer: Medicare Other | Source: Ambulatory Visit | Attending: Cardiology | Admitting: Cardiology

## 2015-09-09 ENCOUNTER — Encounter (HOSPITAL_COMMUNITY): Payer: Self-pay

## 2015-09-09 ENCOUNTER — Inpatient Hospital Stay (HOSPITAL_COMMUNITY): Admission: RE | Admit: 2015-09-09 | Payer: Medicare Other | Source: Ambulatory Visit

## 2015-09-09 DIAGNOSIS — R079 Chest pain, unspecified: Secondary | ICD-10-CM

## 2015-09-09 LAB — NM MYOCAR MULTI W/SPECT W/WALL MOTION / EF
CHL CUP MPHR: 147 {beats}/min
CHL CUP NUCLEAR SSS: 5
CHL RATE OF PERCEIVED EXERTION: 12
CSEPEW: 4.6 METS
CSEPHR: 95 %
CSEPPHR: 141 {beats}/min
Exercise duration (min): 2 min
Exercise duration (sec): 59 s
LHR: 0.28
LVDIAVOL: 64 mL
LVSYSVOL: 16 mL
Rest HR: 66 {beats}/min
SDS: 1
SRS: 4
TID: 0.95

## 2015-09-09 MED ORDER — REGADENOSON 0.4 MG/5ML IV SOLN
INTRAVENOUS | Status: AC
  Filled 2015-09-09: qty 5

## 2015-09-09 MED ORDER — TECHNETIUM TC 99M SESTAMIBI GENERIC - CARDIOLITE
30.0000 | Freq: Once | INTRAVENOUS | Status: AC | PRN
  Administered 2015-09-09: 29 via INTRAVENOUS

## 2015-09-09 MED ORDER — TECHNETIUM TC 99M SESTAMIBI - CARDIOLITE
10.0000 | Freq: Once | INTRAVENOUS | Status: AC | PRN
Start: 2015-09-09 — End: 2015-09-09
  Administered 2015-09-09: 10 via INTRAVENOUS

## 2015-09-09 MED ORDER — SODIUM CHLORIDE 0.9 % IJ SOLN
INTRAMUSCULAR | Status: AC
  Filled 2015-09-09: qty 36

## 2015-09-09 MED ORDER — SODIUM CHLORIDE 0.9 % IJ SOLN
INTRAMUSCULAR | Status: AC
  Administered 2015-09-09: 10 mL via INTRAVENOUS
  Filled 2015-09-09: qty 3

## 2015-09-18 ENCOUNTER — Telehealth: Payer: Self-pay | Admitting: *Deleted

## 2015-09-18 NOTE — Telephone Encounter (Signed)
-----   Message from Arnoldo Lenis, MD sent at 09/17/2015  3:11 PM EDT ----- Stress test overall looks good, no evidence of blockages. We will discuss further at our follow up 11/11  Zandra Abts MD

## 2015-09-18 NOTE — Telephone Encounter (Signed)
Patient informed. 

## 2015-09-25 ENCOUNTER — Encounter: Payer: Self-pay | Admitting: Cardiology

## 2015-09-25 ENCOUNTER — Encounter: Payer: Self-pay | Admitting: *Deleted

## 2015-09-25 ENCOUNTER — Ambulatory Visit (INDEPENDENT_AMBULATORY_CARE_PROVIDER_SITE_OTHER): Payer: Medicare Other | Admitting: Cardiology

## 2015-09-25 VITALS — BP 124/80 | HR 78 | Ht 71.0 in | Wt 225.0 lb

## 2015-09-25 DIAGNOSIS — R0789 Other chest pain: Secondary | ICD-10-CM

## 2015-09-25 DIAGNOSIS — I480 Paroxysmal atrial fibrillation: Secondary | ICD-10-CM

## 2015-09-25 NOTE — Patient Instructions (Signed)
Your physician wants you to follow-up in: [place] DR. BRANCH You will receive a reminder letter in the mail two months in advance. If you don't receive a letter, please call our office to schedule the follow-up appointment.  Your physician recommends that you continue on your current medications as directed. Please refer to the Current Medication list given to you today.  WE WILL REQUEST LABS  Thank you for choosing [facility]!!

## 2015-09-25 NOTE — Progress Notes (Signed)
Patient ID: Noah Mccarthy, male   DOB: 1941-05-16, 74 y.o.   MRN: [id number]     Clinical Summary Noah Mccarthy is a 74 y.o.male former patient of Noah Mccarthy, this is our first visit together. He is seen for the following medical problems.   1. Chest pain - recent episodes of chest pain. At last visit with Noah Mccarthy he was referred for exercise nuclear stress - 08/2015 exercise nuclear stress showed no ST/T changes, poor exercise tolerance, normal perfusion with no ischemia.   - pain has resolved since last visit. Reports only a few episodes mainly after walking up a very high incline on a trail recently, symptoms lasted only a few seconds.  2. Afib - CHADS2Vasc score of 1, he has elected for no anticoag - lopressor has caused fatigue in the past, has been managed with dilt prn only. Episodes are fairly infrequent, only 2 episodes over the last 2.5 years.   3. Hyperlipidemia - complinat with statin  Past Medical History  Diagnosis Date  . Paroxysmal atrial fibrillation ([place])     Hospitalization in June, 2013, spontaneous conversion to normal sinus rhythm, CHADS score is 0, therefore anticoagulation has not been recommended  . Hyperlipidemia   . Prostate cancer ([place])   . Ejection fraction     EF 60%, echo, June, 2013  . Chest tightness     With rapid atrial fibrillation, June, 2013, stress echo normal  . Sinus tachycardia ([place])     [facility], office , December, 2013, no symptoms     No Known Allergies   Current Outpatient Prescriptions  Medication Sig Dispense Refill  . aspirin 81 MG tablet Take 81 mg by mouth daily.    Marland Kitchen diltiazem (CARDIZEM) 30 MG tablet Take 2 tablets (60 mg total) by mouth as needed. for rapid heart rate 15 tablet 2  . rosuvastatin (CRESTOR) 5 MG tablet Take 2.5 mg by mouth every other day.     . vitamin B-12 (CYANOCOBALAMIN) 1000 MCG tablet Take 1,000 mcg by mouth daily.    . vitamin C (ASCORBIC ACID) 500 MG tablet Take 500 mg by mouth daily.     No current  facility-administered medications for this visit.     Past Surgical History  Procedure Laterality Date  . Prostate surgery      For prostate cancer     No Known Allergies    Family History  Problem Relation Age of Onset  . Heart attack Brother 60    cause of death     Social History Noah Mccarthy reports that he has never smoked. He has never used smokeless tobacco. Noah Mccarthy reports that he drinks alcohol.   Review of Systems CONSTITUTIONAL: No weight loss, fever, chills, weakness or fatigue.  HEENT: Eyes: No visual loss, blurred vision, double vision or yellow sclerae.No hearing loss, sneezing, congestion, runny nose or sore throat.  SKIN: No rash or itching.  CARDIOVASCULAR: per hpi RESPIRATORY: No shortness of breath, cough or sputum.  GASTROINTESTINAL: No anorexia, nausea, vomiting or diarrhea. No abdominal pain or blood.  GENITOURINARY: No burning on urination, no polyuria NEUROLOGICAL: No headache, dizziness, syncope, paralysis, ataxia, numbness or tingling in the extremities. No change in bowel or bladder control.  MUSCULOSKELETAL: No muscle, back pain, joint pain or stiffness.  LYMPHATICS: No enlarged nodes. No history of splenectomy.  PSYCHIATRIC: No history of depression or anxiety.  ENDOCRINOLOGIC: No reports of sweating, cold or heat intolerance. No polyuria or polydipsia.  Marland Kitchen   Physical Examination  Filed Vitals:   09/25/15 1018  BP: 124/80  Pulse: 78   Filed Vitals:   09/25/15 1018  Height: 5\' 11"  (1.803 m)  Weight: 225 lb (102.059 kg)    Gen: resting comfortably, no acute distress HEENT: no scleral icterus, pupils equal round and reactive, no palptable cervical adenopathy,  CV: RRR, no m/r/g, no jvd Resp: Clear to auscultation bilaterally GI: abdomen is soft, non-tender, non-distended, normal bowel sounds, no hepatosplenomegaly MSK: extremities are warm, no edema.  Skin: warm, no rash Neuro:  no focal deficits Psych: appropriate  affect   Diagnostic Studies  08/2015 Exercise MPI  Blood pressure demonstrated a hypertensive response to exercise.  There was no ST segment deviation noted during stress.  Poor exercise tolerance with Duke treadmill score of 3 portends a moderate risk for cardiac events. Clinical correlation indicated.  Normal myocardial perfusion.  Nuclear stress EF: 75%.   03/2012 echo LVEF 123456, grade I diastolic dysfunction  Assessment and Plan  1. Chest pain - negative exercise nuclear stress. Symptoms have resolved since last visit - no further cardiac workup at this time  2. Afib - continue rate control. CHADS2Vasc score of 1, he has chosed ASA only  3. Hyperlipidmeia - request most recent labs from pcp - continue statin      Noah Mccarthy, M.D.

## 2015-10-29 DIAGNOSIS — C61 Malignant neoplasm of prostate: Secondary | ICD-10-CM | POA: Diagnosis not present

## 2015-10-29 DIAGNOSIS — N5231 Erectile dysfunction following radical prostatectomy: Secondary | ICD-10-CM | POA: Diagnosis not present

## 2015-11-26 DIAGNOSIS — H2513 Age-related nuclear cataract, bilateral: Secondary | ICD-10-CM | POA: Diagnosis not present

## 2015-11-26 DIAGNOSIS — H35363 Drusen (degenerative) of macula, bilateral: Secondary | ICD-10-CM | POA: Diagnosis not present

## 2015-11-26 DIAGNOSIS — H53022 Refractive amblyopia, left eye: Secondary | ICD-10-CM | POA: Diagnosis not present

## 2015-11-26 DIAGNOSIS — H40013 Open angle with borderline findings, low risk, bilateral: Secondary | ICD-10-CM | POA: Diagnosis not present

## 2016-01-06 ENCOUNTER — Encounter: Payer: Self-pay | Admitting: Cardiology

## 2016-01-06 ENCOUNTER — Encounter: Payer: Self-pay | Admitting: *Deleted

## 2016-01-06 ENCOUNTER — Ambulatory Visit (INDEPENDENT_AMBULATORY_CARE_PROVIDER_SITE_OTHER): Payer: Medicare Other | Admitting: Cardiology

## 2016-01-06 VITALS — BP 131/82 | HR 60 | Ht 71.0 in | Wt 230.0 lb

## 2016-01-06 DIAGNOSIS — R0789 Other chest pain: Secondary | ICD-10-CM | POA: Diagnosis not present

## 2016-01-06 DIAGNOSIS — E785 Hyperlipidemia, unspecified: Secondary | ICD-10-CM

## 2016-01-06 DIAGNOSIS — I480 Paroxysmal atrial fibrillation: Secondary | ICD-10-CM

## 2016-01-06 NOTE — Patient Instructions (Signed)

## 2016-01-06 NOTE — Progress Notes (Signed)
Patient ID: NEPHTALI HIN, male   DOB: [birth year], 75 y.o.   MRN: [id number]     Clinical Summary Mr. Santangelo is a 75 y.o.male seen today for follow up of the following medical problems.   1. History of chest pain - 08/2015 exercise nuclear stress showed no ST/T changes, poor exercise tolerance, normal perfusion with no ischemia.  - denies any recent symptoms since last visit.   2. Afib - CHADS2Vasc score of 1, he has elected for no anticoag - lopressor has caused fatigue in the past, has been managed with dilt prn only. - since last visit had  isolated episode of palpitations that resolved after taking diltizem.    3. Hyperlipidemia - complinat with statin Past Medical History  Diagnosis Date  . Paroxysmal atrial fibrillation ([place])     Hospitalization in June, 2013, spontaneous conversion to normal sinus rhythm, CHADS score is 0, therefore anticoagulation has not been recommended  . Hyperlipidemia   . Prostate cancer ([place])   . Ejection fraction     EF 60%, echo, June, 2013  . Chest tightness     With rapid atrial fibrillation, June, 2013, stress echo normal  . Sinus tachycardia ([place])     [facility], office , December, 2013, no symptoms     No Known Allergies   Current Outpatient Prescriptions  Medication Sig Dispense Refill  . aspirin 81 MG tablet Take 81 mg by mouth daily.    Marland Kitchen diltiazem (CARDIZEM) 30 MG tablet Take 2 tablets (60 mg total) by mouth as needed. for rapid heart rate 15 tablet 2  . rosuvastatin (CRESTOR) 5 MG tablet Take 2.5 mg by mouth every other day.     . vitamin B-12 (CYANOCOBALAMIN) 1000 MCG tablet Take 1,000 mcg by mouth daily.    . vitamin C (ASCORBIC ACID) 500 MG tablet Take 500 mg by mouth daily.     No current facility-administered medications for this visit.     Past Surgical History  Procedure Laterality Date  . Prostate surgery      For prostate cancer     No Known Allergies    Family History  Problem Relation Age of Onset  . Heart  attack Brother 60    cause of death     Social History Mr. Tu reports that he has never smoked. He has never used smokeless tobacco. Mr. Hoglund reports that he drinks alcohol.   Review of Systems CONSTITUTIONAL: No weight loss, fever, chills, weakness or fatigue.  HEENT: Eyes: No visual loss, blurred vision, double vision or yellow sclerae.No hearing loss, sneezing, congestion, runny nose or sore throat.  SKIN: No rash or itching.  CARDIOVASCULAR: per HPI RESPIRATORY: No shortness of breath, cough or sputum.  GASTROINTESTINAL: No anorexia, nausea, vomiting or diarrhea. No abdominal pain or blood.  GENITOURINARY: No burning on urination, no polyuria NEUROLOGICAL: No headache, dizziness, syncope, paralysis, ataxia, numbness or tingling in the extremities. No change in bowel or bladder control.  MUSCULOSKELETAL: No muscle, back pain, joint pain or stiffness.  LYMPHATICS: No enlarged nodes. No history of splenectomy.  PSYCHIATRIC: No history of depression or anxiety.  ENDOCRINOLOGIC: No reports of sweating, cold or heat intolerance. No polyuria or polydipsia.  Marland Kitchen   Physical Examination Filed Vitals:   01/06/16 1051  BP: 131/82  Pulse: 60   Filed Vitals:   01/06/16 1051  Height: 5\' 11"  (1.803 m)  Weight: 230 lb (104.327 kg)    Gen: resting comfortably, no acute distress HEENT: no scleral icterus,  pupils equal round and reactive, no palptable cervical adenopathy,  CV: RRR, no m/r/g, no jvd Resp: Clear to auscultation bilaterally GI: abdomen is soft, non-tender, non-distended, normal bowel sounds, no hepatosplenomegaly MSK: extremities are warm, no edema.  Skin: warm, no rash Neuro:  no focal deficits Psych: appropriate affect   Diagnostic Studies 08/2015 Exercise MPI  Blood pressure demonstrated a hypertensive response to exercise.  There was no ST segment deviation noted during stress.  Poor exercise tolerance with Duke treadmill score of 3 portends a moderate  risk for cardiac events. Clinical correlation indicated.  Normal myocardial perfusion.  Nuclear stress EF: 75%.   03/2012 echo LVEF 123456, grade I diastolic dysfunction    Assessment and Plan  1. Chest pain - negative exercise nuclear stress 08/2015 - no recurrent symptoms, continue to monitor  2. Afib - CHADS2Vasc score of 1, he has chosed ASA only - no significant symptoms, continue to monotir  3. Hyperlipidmeia - request most recent labs from Dr Quintin Alto - continue statin   F/u 1 year   Arnoldo Lenis, M.D.

## 2016-01-26 DIAGNOSIS — R5383 Other fatigue: Secondary | ICD-10-CM | POA: Diagnosis not present

## 2016-01-26 DIAGNOSIS — D519 Vitamin B12 deficiency anemia, unspecified: Secondary | ICD-10-CM | POA: Diagnosis not present

## 2016-01-26 DIAGNOSIS — R739 Hyperglycemia, unspecified: Secondary | ICD-10-CM | POA: Diagnosis not present

## 2016-01-26 DIAGNOSIS — E539 Vitamin B deficiency, unspecified: Secondary | ICD-10-CM | POA: Diagnosis not present

## 2016-01-26 DIAGNOSIS — I482 Chronic atrial fibrillation: Secondary | ICD-10-CM | POA: Diagnosis not present

## 2016-01-26 DIAGNOSIS — E7800 Pure hypercholesterolemia, unspecified: Secondary | ICD-10-CM | POA: Diagnosis not present

## 2016-02-02 DIAGNOSIS — Z0001 Encounter for general adult medical examination with abnormal findings: Secondary | ICD-10-CM | POA: Diagnosis not present

## 2016-02-02 DIAGNOSIS — Z13220 Encounter for screening for lipoid disorders: Secondary | ICD-10-CM | POA: Diagnosis not present

## 2016-02-02 DIAGNOSIS — Z8546 Personal history of malignant neoplasm of prostate: Secondary | ICD-10-CM | POA: Diagnosis not present

## 2016-02-02 DIAGNOSIS — Z1389 Encounter for screening for other disorder: Secondary | ICD-10-CM | POA: Diagnosis not present

## 2016-02-02 DIAGNOSIS — D519 Vitamin B12 deficiency anemia, unspecified: Secondary | ICD-10-CM | POA: Diagnosis not present

## 2016-02-02 DIAGNOSIS — R7301 Impaired fasting glucose: Secondary | ICD-10-CM | POA: Diagnosis not present

## 2016-02-03 DIAGNOSIS — L570 Actinic keratosis: Secondary | ICD-10-CM | POA: Diagnosis not present

## 2016-02-03 DIAGNOSIS — D171 Benign lipomatous neoplasm of skin and subcutaneous tissue of trunk: Secondary | ICD-10-CM | POA: Diagnosis not present

## 2016-02-03 DIAGNOSIS — D485 Neoplasm of uncertain behavior of skin: Secondary | ICD-10-CM | POA: Diagnosis not present

## 2016-02-03 DIAGNOSIS — D1800 Hemangioma unspecified site: Secondary | ICD-10-CM | POA: Diagnosis not present

## 2016-02-03 DIAGNOSIS — D225 Melanocytic nevi of trunk: Secondary | ICD-10-CM | POA: Diagnosis not present

## 2016-02-25 DIAGNOSIS — D485 Neoplasm of uncertain behavior of skin: Secondary | ICD-10-CM | POA: Diagnosis not present

## 2016-02-25 DIAGNOSIS — D225 Melanocytic nevi of trunk: Secondary | ICD-10-CM | POA: Diagnosis not present

## 2016-02-26 DIAGNOSIS — C61 Malignant neoplasm of prostate: Secondary | ICD-10-CM | POA: Diagnosis not present

## 2016-03-10 DIAGNOSIS — D485 Neoplasm of uncertain behavior of skin: Secondary | ICD-10-CM | POA: Diagnosis not present

## 2016-03-10 DIAGNOSIS — D2239 Melanocytic nevi of other parts of face: Secondary | ICD-10-CM | POA: Diagnosis not present

## 2016-05-18 ENCOUNTER — Other Ambulatory Visit: Payer: Self-pay | Admitting: *Deleted

## 2016-05-18 MED ORDER — DILTIAZEM HCL 30 MG PO TABS: 60.0000 mg | ORAL_TABLET | ORAL | Status: DC | PRN

## 2016-07-15 ENCOUNTER — Other Ambulatory Visit: Payer: Self-pay

## 2016-07-27 DIAGNOSIS — Z13220 Encounter for screening for lipoid disorders: Secondary | ICD-10-CM | POA: Diagnosis not present

## 2016-07-27 DIAGNOSIS — R7301 Impaired fasting glucose: Secondary | ICD-10-CM | POA: Diagnosis not present

## 2016-07-27 DIAGNOSIS — E7800 Pure hypercholesterolemia, unspecified: Secondary | ICD-10-CM | POA: Diagnosis not present

## 2016-07-27 DIAGNOSIS — R5383 Other fatigue: Secondary | ICD-10-CM | POA: Diagnosis not present

## 2016-08-02 DIAGNOSIS — R739 Hyperglycemia, unspecified: Secondary | ICD-10-CM | POA: Diagnosis not present

## 2016-08-02 DIAGNOSIS — I482 Chronic atrial fibrillation: Secondary | ICD-10-CM | POA: Diagnosis not present

## 2016-08-02 DIAGNOSIS — E7800 Pure hypercholesterolemia, unspecified: Secondary | ICD-10-CM | POA: Diagnosis not present

## 2016-08-02 DIAGNOSIS — Z23 Encounter for immunization: Secondary | ICD-10-CM | POA: Diagnosis not present

## 2016-08-02 DIAGNOSIS — D519 Vitamin B12 deficiency anemia, unspecified: Secondary | ICD-10-CM | POA: Diagnosis not present

## 2016-09-08 DIAGNOSIS — H2513 Age-related nuclear cataract, bilateral: Secondary | ICD-10-CM | POA: Diagnosis not present

## 2016-09-08 DIAGNOSIS — H53022 Refractive amblyopia, left eye: Secondary | ICD-10-CM | POA: Diagnosis not present

## 2016-09-08 DIAGNOSIS — H40013 Open angle with borderline findings, low risk, bilateral: Secondary | ICD-10-CM | POA: Diagnosis not present

## 2016-09-08 DIAGNOSIS — H35363 Drusen (degenerative) of macula, bilateral: Secondary | ICD-10-CM | POA: Diagnosis not present

## 2016-10-20 DIAGNOSIS — C61 Malignant neoplasm of prostate: Secondary | ICD-10-CM | POA: Diagnosis not present

## 2016-10-20 DIAGNOSIS — N5231 Erectile dysfunction following radical prostatectomy: Secondary | ICD-10-CM | POA: Diagnosis not present

## 2016-10-20 DIAGNOSIS — R972 Elevated prostate specific antigen [PSA]: Secondary | ICD-10-CM | POA: Diagnosis not present

## 2017-01-09 ENCOUNTER — Encounter: Payer: Self-pay | Admitting: Cardiology

## 2017-01-09 ENCOUNTER — Ambulatory Visit (INDEPENDENT_AMBULATORY_CARE_PROVIDER_SITE_OTHER): Payer: Medicare Other | Admitting: Cardiology

## 2017-01-09 ENCOUNTER — Encounter: Payer: Self-pay | Admitting: *Deleted

## 2017-01-09 VITALS — BP 134/79 | HR 74 | Ht 71.0 in | Wt 233.2 lb

## 2017-01-09 DIAGNOSIS — E782 Mixed hyperlipidemia: Secondary | ICD-10-CM | POA: Diagnosis not present

## 2017-01-09 DIAGNOSIS — I480 Paroxysmal atrial fibrillation: Secondary | ICD-10-CM | POA: Diagnosis not present

## 2017-01-09 DIAGNOSIS — R0789 Other chest pain: Secondary | ICD-10-CM

## 2017-01-09 NOTE — Patient Instructions (Signed)

## 2017-01-09 NOTE — Progress Notes (Signed)
Clinical Summary Mr. Paola is a 76 y.o.male seen today for follow up of the following medical problems.   1. History of chest pain - 08/2015 exercise nuclear stress showed no ST/T changes, poor exercise tolerance, normal perfusion with no ischemia.    - denies any chest pain. No SOB or DOE  2. Afib - CHADS2Vasc score of 1, he has elected for no anticoag - lopressor has caused fatigue in the past, has been managed with dilt prn only.  - no recent palpitations. Has not required prn diltiazem. Compliant with ASA  3. Hyperlipidemia - complinat with statin - upcoming labs with pcp Past Medical History:  Diagnosis Date  . Chest tightness    With rapid atrial fibrillation, June, 2013, stress echo normal  . Ejection fraction    EF 60%, echo, June, 2013  . Hyperlipidemia   . Paroxysmal atrial fibrillation ([place])    Hospitalization in June, 2013, spontaneous conversion to normal sinus rhythm, CHADS score is 0, therefore anticoagulation has not been recommended  . Prostate cancer ([place])   . Sinus tachycardia    Mild, office , December, 2013, no symptoms     No Known Allergies   Current Outpatient Prescriptions  Medication Sig Dispense Refill  . aspirin 81 MG tablet Take 81 mg by mouth daily.    Marland Kitchen diltiazem (CARDIZEM) 30 MG tablet Take 2 tablets (60 mg total) by mouth as needed. for rapid heart rate 15 tablet 2  . rosuvastatin (CRESTOR) 5 MG tablet Take 2.5 mg by mouth every other day.     . vitamin B-12 (CYANOCOBALAMIN) 1000 MCG tablet Take 1,000 mcg by mouth daily.    . vitamin C (ASCORBIC ACID) 500 MG tablet Take 500 mg by mouth daily.     No current facility-administered medications for this visit.      Past Surgical History:  Procedure Laterality Date  . PROSTATE SURGERY     For prostate cancer     No Known Allergies    Family History  Problem Relation Age of Onset  . Heart attack Brother 60    cause of death     Social History Mr. Vangorden  reports that he has never smoked. He has never used smokeless tobacco. Mr. Copus reports that he drinks alcohol.   Review of Systems CONSTITUTIONAL: No weight loss, fever, chills, weakness or fatigue.  HEENT: Eyes: No visual loss, blurred vision, double vision or yellow sclerae.No hearing loss, sneezing, congestion, runny nose or sore throat.  SKIN: No rash or itching.  CARDIOVASCULAR: per HPI RESPIRATORY: No shortness of breath, cough or sputum.  GASTROINTESTINAL: No anorexia, nausea, vomiting or diarrhea. No abdominal pain or blood.  GENITOURINARY: No burning on urination, no polyuria NEUROLOGICAL: No headache, dizziness, syncope, paralysis, ataxia, numbness or tingling in the extremities. No change in bowel or bladder control.  MUSCULOSKELETAL: No muscle, back pain, joint pain or stiffness.  LYMPHATICS: No enlarged nodes. No history of splenectomy.  PSYCHIATRIC: No history of depression or anxiety.  ENDOCRINOLOGIC: No reports of sweating, cold or heat intolerance. No polyuria or polydipsia.  Marland Kitchen   Physical Examination Vitals:   01/09/17 1259  BP: 134/79  Pulse: 74   Vitals:   01/09/17 1259  Weight: 233 lb 3.2 oz (105.8 kg)  Height: 5\' 11"  (1.803 m)    Gen: resting comfortably, no acute distress HEENT: no scleral icterus, pupils equal round and reactive, no palptable cervical adenopathy,  CV: RRR, no m/r/g, no jvd Resp: Clear to  auscultation bilaterally GI: abdomen is soft, non-tender, non-distended, normal bowel sounds, no hepatosplenomegaly MSK: extremities are warm, no edema.  Skin: warm, no rash Neuro:  no focal deficits Psych: appropriate affect   Diagnostic Studies 08/2015 Exercise MPI  Blood pressure demonstrated a hypertensive response to exercise.  There was no ST segment deviation noted during stress.  Poor exercise tolerance with Duke treadmill score of 3 portends a moderate risk for cardiac events. Clinical correlation indicated.  Normal myocardial  perfusion.  Nuclear stress EF: 75%.   03/2012 echo LVEF 123456, grade I diastolic dysfunction    Assessment and Plan  1. Chest pain - negative exercise nuclear stress 08/2015 - no recent symptoms - continue to monitor.   2. Afib - CHADS2Vasc score of 1, continue ASA - no significant symptoms, continue prn dilt - we discussed in detail today that based on guidelines at age 50 anticoagulation as opposed to ASA alone would be recommended for stroke prevention as his CHADS2Vasc score would be 2. He wishes to give this some thought for now and will contact [place] in a few months after his 58th birthday if he decides to start.   3. Hyperlipidmeia - request most recent labs from Dr Quintin Alto - continue statin   F/u 1 year      Arnoldo Lenis, M.D.

## 2017-01-27 DIAGNOSIS — C61 Malignant neoplasm of prostate: Secondary | ICD-10-CM | POA: Diagnosis not present

## 2017-01-27 DIAGNOSIS — E7800 Pure hypercholesterolemia, unspecified: Secondary | ICD-10-CM | POA: Diagnosis not present

## 2017-01-27 DIAGNOSIS — R739 Hyperglycemia, unspecified: Secondary | ICD-10-CM | POA: Diagnosis not present

## 2017-01-27 DIAGNOSIS — R5383 Other fatigue: Secondary | ICD-10-CM | POA: Diagnosis not present

## 2017-01-27 DIAGNOSIS — D519 Vitamin B12 deficiency anemia, unspecified: Secondary | ICD-10-CM | POA: Diagnosis not present

## 2017-01-31 DIAGNOSIS — E7800 Pure hypercholesterolemia, unspecified: Secondary | ICD-10-CM | POA: Diagnosis not present

## 2017-01-31 DIAGNOSIS — Z8546 Personal history of malignant neoplasm of prostate: Secondary | ICD-10-CM | POA: Diagnosis not present

## 2017-01-31 DIAGNOSIS — R739 Hyperglycemia, unspecified: Secondary | ICD-10-CM | POA: Diagnosis not present

## 2017-01-31 DIAGNOSIS — D519 Vitamin B12 deficiency anemia, unspecified: Secondary | ICD-10-CM | POA: Diagnosis not present

## 2017-01-31 DIAGNOSIS — Z6833 Body mass index (BMI) 33.0-33.9, adult: Secondary | ICD-10-CM | POA: Diagnosis not present

## 2017-01-31 DIAGNOSIS — I482 Chronic atrial fibrillation: Secondary | ICD-10-CM | POA: Diagnosis not present

## 2017-02-01 DIAGNOSIS — L821 Other seborrheic keratosis: Secondary | ICD-10-CM | POA: Diagnosis not present

## 2017-02-01 DIAGNOSIS — D1800 Hemangioma unspecified site: Secondary | ICD-10-CM | POA: Diagnosis not present

## 2017-02-01 DIAGNOSIS — L570 Actinic keratosis: Secondary | ICD-10-CM | POA: Diagnosis not present

## 2017-03-19 IMAGING — NM NM MYOCAR MULTI W/SPECT W/WALL MOTION & EF
2 series · 12 of 12 positions shown · non-contrast
Comparison: none

[Series 1: rest · 8.28mm/px · 6 of 64 frames shown]
[frame 6/64]
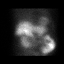
[frame 16/64]
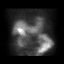
[frame 27/64]
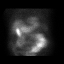
[frame 38/64]
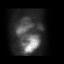
[frame 48/64]
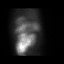
[frame 59/64]
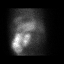

[Series 2: stress gated · 8.28mm/px · 6 of 64 frames shown]
[frame 6/64]
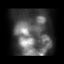
[frame 16/64]
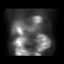
[frame 27/64]
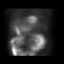
[frame 38/64]
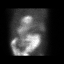
[frame 48/64]
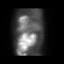
[frame 59/64]
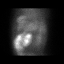

[12 of 12 positions shown; findings below may reference images not displayed]

Canned report from images found in remote index.

Refer to host system for actual result text.

## 2017-08-28 DIAGNOSIS — R5383 Other fatigue: Secondary | ICD-10-CM | POA: Diagnosis not present

## 2017-08-28 DIAGNOSIS — R739 Hyperglycemia, unspecified: Secondary | ICD-10-CM | POA: Diagnosis not present

## 2017-08-28 DIAGNOSIS — E7800 Pure hypercholesterolemia, unspecified: Secondary | ICD-10-CM | POA: Diagnosis not present

## 2017-08-28 DIAGNOSIS — I482 Chronic atrial fibrillation: Secondary | ICD-10-CM | POA: Diagnosis not present

## 2017-08-30 DIAGNOSIS — E7800 Pure hypercholesterolemia, unspecified: Secondary | ICD-10-CM | POA: Diagnosis not present

## 2017-08-30 DIAGNOSIS — D519 Vitamin B12 deficiency anemia, unspecified: Secondary | ICD-10-CM | POA: Diagnosis not present

## 2017-08-30 DIAGNOSIS — Z8546 Personal history of malignant neoplasm of prostate: Secondary | ICD-10-CM | POA: Diagnosis not present

## 2017-08-30 DIAGNOSIS — R739 Hyperglycemia, unspecified: Secondary | ICD-10-CM | POA: Diagnosis not present

## 2017-08-30 DIAGNOSIS — Z6833 Body mass index (BMI) 33.0-33.9, adult: Secondary | ICD-10-CM | POA: Diagnosis not present

## 2017-08-30 DIAGNOSIS — Z0001 Encounter for general adult medical examination with abnormal findings: Secondary | ICD-10-CM | POA: Diagnosis not present

## 2017-08-30 DIAGNOSIS — Z23 Encounter for immunization: Secondary | ICD-10-CM | POA: Diagnosis not present

## 2017-08-30 DIAGNOSIS — E782 Mixed hyperlipidemia: Secondary | ICD-10-CM | POA: Diagnosis not present

## 2017-08-30 DIAGNOSIS — I482 Chronic atrial fibrillation: Secondary | ICD-10-CM | POA: Diagnosis not present

## 2017-09-11 DIAGNOSIS — H2513 Age-related nuclear cataract, bilateral: Secondary | ICD-10-CM | POA: Diagnosis not present

## 2017-09-11 DIAGNOSIS — H40013 Open angle with borderline findings, low risk, bilateral: Secondary | ICD-10-CM | POA: Diagnosis not present

## 2017-09-11 DIAGNOSIS — H353132 Nonexudative age-related macular degeneration, bilateral, intermediate dry stage: Secondary | ICD-10-CM | POA: Diagnosis not present

## 2017-09-11 DIAGNOSIS — H53022 Refractive amblyopia, left eye: Secondary | ICD-10-CM | POA: Diagnosis not present

## 2017-09-20 ENCOUNTER — Other Ambulatory Visit: Payer: Self-pay | Admitting: Dermatology

## 2017-10-09 ENCOUNTER — Telehealth: Payer: Self-pay | Admitting: Cardiology

## 2017-10-09 NOTE — Telephone Encounter (Signed)
Now that he is 36, due to his afib anticoagulation is now recommended. I would recommend starting eliquis 5mg  bid. This lowers stroke risk 66%, vs just 20% with aspirin alone. If he has not had any recent bleeding and is willing to start, then please give Rx and stop his ASA. Please request most recent labs from his pcp as well   Carlyle Dolly MD

## 2017-10-09 NOTE — Telephone Encounter (Signed)
Pt was seen in Feb and at that time wanted to think about it prior to starting anticoag. Will forward to Dr Harl Bowie

## 2017-10-09 NOTE — Telephone Encounter (Signed)
Pt voiced understanding, however would like to wait and check on Eliquis prices and insurance and discuss with Dr Harl Bowie at upcoming Feb appt.

## 2017-10-09 NOTE — Telephone Encounter (Signed)
Would like to know if he needs to start blood thinner.  Stated that Dr Harl Bowie mentioned to him last visit and needs to figure out and let his insurance company know by Dec 2018

## 2017-10-20 DIAGNOSIS — N5231 Erectile dysfunction following radical prostatectomy: Secondary | ICD-10-CM | POA: Diagnosis not present

## 2017-10-20 DIAGNOSIS — C61 Malignant neoplasm of prostate: Secondary | ICD-10-CM | POA: Diagnosis not present

## 2017-12-26 ENCOUNTER — Encounter: Payer: Self-pay | Admitting: Cardiology

## 2017-12-26 ENCOUNTER — Ambulatory Visit (INDEPENDENT_AMBULATORY_CARE_PROVIDER_SITE_OTHER): Payer: Medicare Other | Admitting: Cardiology

## 2017-12-26 VITALS — BP 140/84 | HR 100 | Ht 71.0 in | Wt 241.0 lb

## 2017-12-26 DIAGNOSIS — E782 Mixed hyperlipidemia: Secondary | ICD-10-CM

## 2017-12-26 DIAGNOSIS — R0789 Other chest pain: Secondary | ICD-10-CM | POA: Diagnosis not present

## 2017-12-26 DIAGNOSIS — I4891 Unspecified atrial fibrillation: Secondary | ICD-10-CM

## 2017-12-26 MED ORDER — DILTIAZEM HCL 30 MG PO TABS: 60.0000 mg | ORAL_TABLET | Freq: Two times a day (BID) | ORAL | 3 refills | Status: DC

## 2017-12-26 MED ORDER — APIXABAN 5 MG PO TABS: 5.0000 mg | ORAL_TABLET | Freq: Two times a day (BID) | ORAL | 11 refills | Status: DC

## 2017-12-26 NOTE — Patient Instructions (Signed)
Medication Instructions:  INCREASE DILTAIZEM TO 30 MG - TWO TIMES DAILY  START ELIQUIS 5 MG-  TWO TIMES DAILY   Labwork: NONE  Testing/Procedures: NONE  Follow-Up: Your physician wants you to follow-up in: 6 MONTHS.  You will receive a reminder letter in the mail two months in advance. If you don't receive a letter, please call our office to schedule the follow-up appointment.  Your physician recommends that you schedule a follow-up appointment in: ASAP with Edrick Oh, RN.   Any Other Special Instructions Will Be Listed Below (If Applicable).     If you need a refill on your cardiac medications before your next appointment, please call your pharmacy.

## 2017-12-26 NOTE — Progress Notes (Signed)
Clinical Summary Noah Mccarthy is a 77 y.o.male seen today for follow up of the following medical problems.   1. History of chest pain - 08/2015 exercise nuclear stress showed no ST/T changes, poor exercise tolerance, normal perfusion with no ischemia.    - denies any recent chest pain. No SOB or DOE  2. Afib - CHADS2Vasc score of 1, he has elected for no anticoag - lopressor has caused fatigue in the past, has been managed with dilt prn only.  - has been taking prn dilt for palpitations. Not improved with 30mg , better with 60mg  prn.  - 2 episodes over the last month. One occurred while stacking firewood. Lasted over an hour, took prn dilt with improvement. - 2 cups of coffee daily.   3. Hyperlipidemia - compliant with statin, labs followed by pcp   Past Medical History:  Diagnosis Date  . Chest tightness    With rapid atrial fibrillation, June, 2013, stress echo normal  . Ejection fraction    EF 60%, echo, June, 2013  . Hyperlipidemia   . Paroxysmal atrial fibrillation ([place])    Hospitalization in June, 2013, spontaneous conversion to normal sinus rhythm, CHADS score is 0, therefore anticoagulation has not been recommended  . Prostate cancer ([place])   . Sinus tachycardia    Mild, office , December, 2013, no symptoms     No Known Allergies   Current Outpatient Medications  Medication Sig Dispense Refill  . aspirin 81 MG tablet Take 81 mg by mouth daily.    Marland Kitchen diltiazem (CARDIZEM) 30 MG tablet Take 2 tablets (60 mg total) by mouth as needed. for rapid heart rate 15 tablet 2  . rosuvastatin (CRESTOR) 5 MG tablet Take 2.5 mg by mouth every other day.     . vitamin B-12 (CYANOCOBALAMIN) 1000 MCG tablet Take 1,000 mcg by mouth daily.    . vitamin C (ASCORBIC ACID) 500 MG tablet Take 500 mg by mouth as needed.      No current facility-administered medications for this visit.      Past Surgical History:  Procedure Laterality Date  . PROSTATE SURGERY     For  prostate cancer     No Known Allergies    Family History  Problem Relation Age of Onset  . Heart attack Brother 60       cause of death     Social History Noah Mccarthy reports that  has never smoked. he has never used smokeless tobacco. Noah Mccarthy reports that he drinks alcohol.   Review of Systems CONSTITUTIONAL: No weight loss, fever, chills, weakness or fatigue.  HEENT: Eyes: No visual loss, blurred vision, double vision or yellow sclerae.No hearing loss, sneezing, congestion, runny nose or sore throat.  SKIN: No rash or itching.  CARDIOVASCULAR: per hpi RESPIRATORY: No shortness of breath, cough or sputum.  GASTROINTESTINAL: No anorexia, nausea, vomiting or diarrhea. No abdominal pain or blood.  GENITOURINARY: No burning on urination, no polyuria NEUROLOGICAL: No headache, dizziness, syncope, paralysis, ataxia, numbness or tingling in the extremities. No change in bowel or bladder control.  MUSCULOSKELETAL: No muscle, back pain, joint pain or stiffness.  LYMPHATICS: No enlarged nodes. No history of splenectomy.  PSYCHIATRIC: No history of depression or anxiety.  ENDOCRINOLOGIC: No reports of sweating, cold or heat intolerance. No polyuria or polydipsia.  Marland Kitchen   Physical Examination Vitals:   12/26/17 1248  BP: 140/84  Pulse: 100  SpO2: 97%   Vitals:   12/26/17 1248  Weight: 241  lb (109.3 kg)  Height: 5\' 11"  (1.803 m)    Gen: resting comfortably, no acute distress HEENT: no scleral icterus, pupils equal round and reactive, no palptable cervical adenopathy,  CV: RRR, no m/r/g, no jvd Resp: Clear to auscultation bilaterally GI: abdomen is soft, non-tender, non-distended, normal bowel sounds, no hepatosplenomegaly MSK: extremities are warm, no edema.  Skin: warm, no rash Neuro:  no focal deficits Psych: appropriate affect   Diagnostic Studies 08/2015 Exercise MPI  Blood pressure demonstrated a hypertensive response to exercise.  There was no ST segment  deviation noted during stress.  Poor exercise tolerance with Duke treadmill score of 3 portends a moderate risk for cardiac events. Clinical correlation indicated.  Normal myocardial perfusion.  Nuclear stress EF: 75%.   03/2012 echo LVEF 47%, grade I diastolic dysfunction     Assessment and Plan  1. Chest pain - negative exercise nuclear stress 08/2015 - no recent symptoms, we will continue to monitor.   2. Afib - CHADS2Vasc score of 2 now given his age, we will stop ASA and start eliquis.  - increased palpitations, we will change his dilt to 30mg  bid, ok to take additional prn palpitations.    3. Hyperlipidmeia - request labs from pcp, continue current meds   F/u 6 months      Arnoldo Lenis, M.D.

## 2017-12-28 ENCOUNTER — Telehealth: Payer: Self-pay | Admitting: Cardiology

## 2017-12-28 NOTE — Telephone Encounter (Signed)
Per phone call from pt assistance Willy Eddy [phone]   Pt doesn't have Rx drug coverage, he's going to be sent directly to [facility], tele # [phone] or website [email or web address]

## 2017-12-29 ENCOUNTER — Telehealth: Payer: Self-pay

## 2017-12-29 MED ORDER — APIXABAN 5 MG PO TABS: 5.0000 mg | ORAL_TABLET | Freq: Two times a day (BID) | ORAL | 3 refills | Status: DC

## 2017-12-29 NOTE — Telephone Encounter (Signed)
Faxed pt assistance 12/29/17

## 2017-12-29 NOTE — Telephone Encounter (Signed)
Sent in pt assistance 12/29/17.

## 2018-01-02 ENCOUNTER — Encounter: Payer: Self-pay | Admitting: Cardiology

## 2018-01-02 ENCOUNTER — Other Ambulatory Visit: Payer: Self-pay | Admitting: Dermatology

## 2018-01-09 ENCOUNTER — Ambulatory Visit: Payer: Medicare Other | Admitting: Cardiology

## 2018-01-16 ENCOUNTER — Telehealth: Payer: Self-pay | Admitting: *Deleted

## 2018-01-16 NOTE — Telephone Encounter (Signed)
Pt in office to pick up patient assistance Eliquis

## 2018-01-24 ENCOUNTER — Ambulatory Visit: Payer: Medicare Other | Admitting: *Deleted

## 2018-01-24 ENCOUNTER — Other Ambulatory Visit (HOSPITAL_COMMUNITY)
Admission: RE | Admit: 2018-01-24 | Discharge: 2018-01-24 | Disposition: A | Discharge status: Home / Self Care | Payer: Medicare Other | Source: Ambulatory Visit | Attending: Cardiology | Admitting: Cardiology

## 2018-01-24 DIAGNOSIS — Z5181 Encounter for therapeutic drug level monitoring: Secondary | ICD-10-CM

## 2018-01-24 DIAGNOSIS — I480 Paroxysmal atrial fibrillation: Secondary | ICD-10-CM

## 2018-01-24 LAB — BASIC METABOLIC PANEL
ANION GAP: 10 (ref 5–15)
BUN: 17 mg/dL (ref 6–20)
CO2: 25 mmol/L (ref 22–32)
CREATININE: 0.83 mg/dL (ref 0.61–1.24)
Calcium: 9.2 mg/dL (ref 8.9–10.3)
Chloride: 107 mmol/L (ref 101–111)
GFR calc Af Amer: >60 mL/min (ref >60)
Glucose, Bld: 97 mg/dL (ref 65–99)
Potassium: 4.3 mmol/L (ref 3.5–5.1)
SODIUM: 142 mmol/L (ref 135–145)

## 2018-01-24 LAB — CBC
HCT: 46.8 % (ref 39.0–52.0)
HEMOGLOBIN: 15.5 g/dL (ref 13.0–17.0)
MCH: 31.8 pg (ref 26.0–34.0)
MCHC: 33.1 g/dL (ref 30.0–36.0)
MCV: 95.9 fL (ref 78.0–100.0)
PLATELETS: 214 10*3/uL (ref 150–400)
RBC: 4.88 MIL/uL (ref 4.22–5.81)
RDW: 12.4 % (ref 11.5–15.5)
WBC: 6.3 10*3/uL (ref 4.0–10.5)

## 2018-01-24 NOTE — Progress Notes (Signed)
1 month Eliquis follow up.  Pt was started on Eliquis 5mg  bid for atrial fib on 01/16/18 by Dr Harl Bowie.    Reviewed patients medication list.  Pt is not currently on any combined P-gp and strong CYP3A4 inhibitors/inducers (ketoconazole, traconazole, ritonavir, carbamazepine, phenytoin, rifampin, St. John's wort).  Reviewed labs from 07/27/16.  SCr 1.09, Weight 109kg,   Dose is appropriate based on age, weight, and SCr.  Hgb and HCT: No recent value.  Sent pt to [facility] lab today for CBC and BMP after being on Eliquis x 1 month.  01/24/18:  SCr 0.83  CrCl 118.56 Hgb 15.5   Hct 46.8  A full discussion of the nature of anticoagulants has been carried out.  A benefit/risk analysis has been presented to the patient, so that they understand the justification for choosing anticoagulation with Eliquis at this time.  The need for compliance is stressed.  Pt is aware to take the medication twice daily.  Side effects of potential bleeding are discussed, including unusual colored urine or stools, coughing up blood or coffee ground emesis, nose bleeds or serious fall or head trauma.  Discussed signs and symptoms of stroke. The patient should avoid any OTC items containing aspirin or ibuprofen.  Avoid alcohol consumption.   Call if any signs of abnormal bleeding.  Discussed financial obligations and resolved any difficulty in obtaining medication.  Next lab test in 6 months.   Called pt with lab results.  6 month f/u placed in recall.  Pt will be leaving for [place] the first of April and will be gone till September 2019.  Pt is getting Eliquis thru patient assistance.  Contacted them to see if he can get 3 more months of Eliquis early to take with him to [place] so he doesn't run out.  This has been approved.

## 2018-02-05 DIAGNOSIS — L820 Inflamed seborrheic keratosis: Secondary | ICD-10-CM | POA: Diagnosis not present

## 2018-02-05 DIAGNOSIS — L570 Actinic keratosis: Secondary | ICD-10-CM | POA: Diagnosis not present

## 2018-02-05 DIAGNOSIS — D2262 Melanocytic nevi of left upper limb, including shoulder: Secondary | ICD-10-CM | POA: Diagnosis not present

## 2018-02-05 DIAGNOSIS — L821 Other seborrheic keratosis: Secondary | ICD-10-CM | POA: Diagnosis not present

## 2018-02-05 DIAGNOSIS — L814 Other melanin hyperpigmentation: Secondary | ICD-10-CM | POA: Diagnosis not present

## 2018-02-05 DIAGNOSIS — D485 Neoplasm of uncertain behavior of skin: Secondary | ICD-10-CM | POA: Diagnosis not present

## 2018-02-05 DIAGNOSIS — D1800 Hemangioma unspecified site: Secondary | ICD-10-CM | POA: Diagnosis not present

## 2018-02-09 DIAGNOSIS — C61 Malignant neoplasm of prostate: Secondary | ICD-10-CM | POA: Diagnosis not present

## 2018-02-22 DIAGNOSIS — D519 Vitamin B12 deficiency anemia, unspecified: Secondary | ICD-10-CM | POA: Diagnosis not present

## 2018-02-22 DIAGNOSIS — E7800 Pure hypercholesterolemia, unspecified: Secondary | ICD-10-CM | POA: Diagnosis not present

## 2018-02-22 DIAGNOSIS — I482 Chronic atrial fibrillation: Secondary | ICD-10-CM | POA: Diagnosis not present

## 2018-02-22 DIAGNOSIS — R739 Hyperglycemia, unspecified: Secondary | ICD-10-CM | POA: Diagnosis not present

## 2018-02-22 DIAGNOSIS — E539 Vitamin B deficiency, unspecified: Secondary | ICD-10-CM | POA: Diagnosis not present

## 2018-02-22 DIAGNOSIS — E782 Mixed hyperlipidemia: Secondary | ICD-10-CM | POA: Diagnosis not present

## 2018-02-22 DIAGNOSIS — Z8546 Personal history of malignant neoplasm of prostate: Secondary | ICD-10-CM | POA: Diagnosis not present

## 2018-02-22 DIAGNOSIS — R5383 Other fatigue: Secondary | ICD-10-CM | POA: Diagnosis not present

## 2018-02-26 DIAGNOSIS — R5383 Other fatigue: Secondary | ICD-10-CM | POA: Diagnosis not present

## 2018-02-26 DIAGNOSIS — Z6833 Body mass index (BMI) 33.0-33.9, adult: Secondary | ICD-10-CM | POA: Diagnosis not present

## 2018-02-26 DIAGNOSIS — I482 Chronic atrial fibrillation: Secondary | ICD-10-CM | POA: Diagnosis not present

## 2018-02-26 DIAGNOSIS — D519 Vitamin B12 deficiency anemia, unspecified: Secondary | ICD-10-CM | POA: Diagnosis not present

## 2018-02-26 DIAGNOSIS — Z1389 Encounter for screening for other disorder: Secondary | ICD-10-CM | POA: Diagnosis not present

## 2018-02-26 DIAGNOSIS — E782 Mixed hyperlipidemia: Secondary | ICD-10-CM | POA: Diagnosis not present

## 2018-02-26 DIAGNOSIS — R739 Hyperglycemia, unspecified: Secondary | ICD-10-CM | POA: Diagnosis not present

## 2018-02-26 DIAGNOSIS — Z8546 Personal history of malignant neoplasm of prostate: Secondary | ICD-10-CM | POA: Diagnosis not present

## 2018-02-27 ENCOUNTER — Telehealth: Payer: Self-pay | Admitting: Cardiology

## 2018-02-27 MED ORDER — DILTIAZEM HCL 30 MG PO TABS: 30.0000 mg | ORAL_TABLET | Freq: Two times a day (BID) | ORAL | 0 refills | Status: DC | PRN

## 2018-02-27 NOTE — Telephone Encounter (Signed)
[id number] 

## 2018-02-27 NOTE — Telephone Encounter (Signed)
Pt notified and voiced understanding 

## 2018-02-27 NOTE — Telephone Encounter (Signed)
There is no lower dose than 30mg  bid. He can go back to taking the diltiazem prn palpitations like he was before.   Zandra Abts MD

## 2018-02-27 NOTE — Telephone Encounter (Signed)
Patient states that after starting Cardizem daily at 30 mg BID, he has been fatigue. Pt denies palpitations and feeling dizzy. Pt does report have no energy as the day progressed. Pt would like to decrease cardizem dose. Please advise.

## 2018-02-27 NOTE — Telephone Encounter (Signed)
Would like to speak with nurse regarding feeling fatigue due to Cardiazem. / tg

## 2018-03-30 ENCOUNTER — Telehealth: Payer: Self-pay | Admitting: *Deleted

## 2018-03-30 NOTE — Telephone Encounter (Signed)
Patient notified that his patient assistance Eliquis has arrived in office. Pt is in [place] and will have his brother stop by office.

## 2018-06-28 ENCOUNTER — Telehealth: Payer: Self-pay | Admitting: *Deleted

## 2018-06-28 NOTE — Telephone Encounter (Signed)
Informed pt that patient assistance Eliquis has arrived in office. Pt stated that he is out of the country and will pick it up when he returns in Oct.

## 2018-08-23 DIAGNOSIS — R739 Hyperglycemia, unspecified: Secondary | ICD-10-CM | POA: Diagnosis not present

## 2018-08-23 DIAGNOSIS — E7800 Pure hypercholesterolemia, unspecified: Secondary | ICD-10-CM | POA: Diagnosis not present

## 2018-08-23 DIAGNOSIS — E782 Mixed hyperlipidemia: Secondary | ICD-10-CM | POA: Diagnosis not present

## 2018-08-23 DIAGNOSIS — D519 Vitamin B12 deficiency anemia, unspecified: Secondary | ICD-10-CM | POA: Diagnosis not present

## 2018-08-23 DIAGNOSIS — R7301 Impaired fasting glucose: Secondary | ICD-10-CM | POA: Diagnosis not present

## 2018-08-23 DIAGNOSIS — R5383 Other fatigue: Secondary | ICD-10-CM | POA: Diagnosis not present

## 2018-08-23 DIAGNOSIS — E539 Vitamin B deficiency, unspecified: Secondary | ICD-10-CM | POA: Diagnosis not present

## 2018-08-27 DIAGNOSIS — E782 Mixed hyperlipidemia: Secondary | ICD-10-CM | POA: Diagnosis not present

## 2018-08-27 DIAGNOSIS — Z6834 Body mass index (BMI) 34.0-34.9, adult: Secondary | ICD-10-CM | POA: Diagnosis not present

## 2018-08-27 DIAGNOSIS — R739 Hyperglycemia, unspecified: Secondary | ICD-10-CM | POA: Diagnosis not present

## 2018-08-27 DIAGNOSIS — R5383 Other fatigue: Secondary | ICD-10-CM | POA: Diagnosis not present

## 2018-08-27 DIAGNOSIS — Z23 Encounter for immunization: Secondary | ICD-10-CM | POA: Diagnosis not present

## 2018-08-27 DIAGNOSIS — I482 Chronic atrial fibrillation, unspecified: Secondary | ICD-10-CM | POA: Diagnosis not present

## 2018-08-27 DIAGNOSIS — Z8546 Personal history of malignant neoplasm of prostate: Secondary | ICD-10-CM | POA: Diagnosis not present

## 2018-08-27 DIAGNOSIS — D519 Vitamin B12 deficiency anemia, unspecified: Secondary | ICD-10-CM | POA: Diagnosis not present

## 2018-09-18 ENCOUNTER — Telehealth: Payer: Self-pay | Admitting: *Deleted

## 2018-09-18 NOTE — Telephone Encounter (Signed)
Pt notified that patient assistance eliquis has arrived in office. Pt informed to bring proof of income to renew for pt assistance.

## 2018-10-03 DIAGNOSIS — H353132 Nonexudative age-related macular degeneration, bilateral, intermediate dry stage: Secondary | ICD-10-CM | POA: Diagnosis not present

## 2018-10-03 DIAGNOSIS — H53022 Refractive amblyopia, left eye: Secondary | ICD-10-CM | POA: Diagnosis not present

## 2018-10-03 DIAGNOSIS — H25813 Combined forms of age-related cataract, bilateral: Secondary | ICD-10-CM | POA: Diagnosis not present

## 2018-10-05 ENCOUNTER — Telehealth: Payer: Self-pay

## 2018-10-05 NOTE — Telephone Encounter (Signed)
Rep for eliquis confirmed our address, will be shipped to our office.She is unsure how long this will take, pt made aware (LM on cell)

## 2018-10-18 DIAGNOSIS — C61 Malignant neoplasm of prostate: Secondary | ICD-10-CM | POA: Diagnosis not present

## 2018-10-18 DIAGNOSIS — N5231 Erectile dysfunction following radical prostatectomy: Secondary | ICD-10-CM | POA: Diagnosis not present

## 2019-01-18 DIAGNOSIS — C61 Malignant neoplasm of prostate: Secondary | ICD-10-CM | POA: Diagnosis not present

## 2019-01-22 DIAGNOSIS — I10 Essential (primary) hypertension: Secondary | ICD-10-CM | POA: Diagnosis not present

## 2019-01-22 DIAGNOSIS — I482 Chronic atrial fibrillation, unspecified: Secondary | ICD-10-CM | POA: Diagnosis not present

## 2019-01-22 DIAGNOSIS — Z6835 Body mass index (BMI) 35.0-35.9, adult: Secondary | ICD-10-CM | POA: Diagnosis not present

## 2019-01-22 DIAGNOSIS — E782 Mixed hyperlipidemia: Secondary | ICD-10-CM | POA: Diagnosis not present

## 2019-02-06 DIAGNOSIS — L570 Actinic keratosis: Secondary | ICD-10-CM | POA: Diagnosis not present

## 2019-02-14 ENCOUNTER — Ambulatory Visit: Payer: Medicare Other | Admitting: Cardiology

## 2019-02-18 DIAGNOSIS — R739 Hyperglycemia, unspecified: Secondary | ICD-10-CM | POA: Diagnosis not present

## 2019-02-18 DIAGNOSIS — E7800 Pure hypercholesterolemia, unspecified: Secondary | ICD-10-CM | POA: Diagnosis not present

## 2019-02-18 DIAGNOSIS — E539 Vitamin B deficiency, unspecified: Secondary | ICD-10-CM | POA: Diagnosis not present

## 2019-02-18 DIAGNOSIS — E782 Mixed hyperlipidemia: Secondary | ICD-10-CM | POA: Diagnosis not present

## 2019-02-18 DIAGNOSIS — I482 Chronic atrial fibrillation, unspecified: Secondary | ICD-10-CM | POA: Diagnosis not present

## 2019-02-18 DIAGNOSIS — I10 Essential (primary) hypertension: Secondary | ICD-10-CM | POA: Diagnosis not present

## 2019-02-21 DIAGNOSIS — Z6835 Body mass index (BMI) 35.0-35.9, adult: Secondary | ICD-10-CM | POA: Diagnosis not present

## 2019-02-21 DIAGNOSIS — I4891 Unspecified atrial fibrillation: Secondary | ICD-10-CM | POA: Diagnosis not present

## 2019-02-21 DIAGNOSIS — E782 Mixed hyperlipidemia: Secondary | ICD-10-CM | POA: Diagnosis not present

## 2019-02-21 DIAGNOSIS — R5383 Other fatigue: Secondary | ICD-10-CM | POA: Diagnosis not present

## 2019-02-21 DIAGNOSIS — R739 Hyperglycemia, unspecified: Secondary | ICD-10-CM | POA: Diagnosis not present

## 2019-02-21 DIAGNOSIS — D519 Vitamin B12 deficiency anemia, unspecified: Secondary | ICD-10-CM | POA: Diagnosis not present

## 2019-02-21 DIAGNOSIS — Z8546 Personal history of malignant neoplasm of prostate: Secondary | ICD-10-CM | POA: Diagnosis not present

## 2019-03-07 ENCOUNTER — Telehealth: Payer: Self-pay

## 2019-03-07 NOTE — Telephone Encounter (Signed)
Pt notified his Eliquis has arrived form [employer], he will pick up in a couple days

## 2019-05-03 ENCOUNTER — Telehealth: Payer: Self-pay | Admitting: Cardiology

## 2019-05-03 NOTE — Telephone Encounter (Signed)
Virtual Visit Pre-Appointment Phone Call  "(Name), I am calling you today to discuss your upcoming appointment. We are currently trying to limit exposure to the virus that causes COVID-19 by seeing patients at home rather than in the office."  1. "What is the BEST phone number to call the day of the visit?" - include this in appointment notes  2. Do you have or have access to (through a family member/friend) a smartphone with video capability that we can use for your visit?" a. If yes - list this number in appt notes as cell (if different from BEST phone #) and list the appointment type as a VIDEO visit in appointment notes b. If no - list the appointment type as a PHONE visit in appointment notes  3. Confirm consent - "In the setting of the current Covid19 crisis, you are scheduled for a (phone or video) visit with your provider on (date) at (time).  Just as we do with many in-office visits, in order for you to participate in this visit, we must obtain consent.  If you'd like, I can send this to your mychart (if signed up) or email for you to review.  Otherwise, I can obtain your verbal consent now.  All virtual visits are billed to your insurance company just like a normal visit would be.  By agreeing to a virtual visit, we'd like you to understand that the technology does not allow for your provider to perform an examination, and thus may limit your provider's ability to fully assess your condition. If your provider identifies any concerns that need to be evaluated in person, we will make arrangements to do so.  Finally, though the technology is pretty good, we cannot assure that it will always work on either your or our end, and in the setting of a video visit, we may have to convert it to a phone-only visit.  In either situation, we cannot ensure that we have a secure connection.  Are you willing to proceed?" STAFF: Did the patient verbally acknowledge consent to telehealth visit? Document  YES/NO here: Yes  4. Advise patient to be prepared - "Two hours prior to your appointment, go ahead and check your blood pressure, pulse, oxygen saturation, and your weight (if you have the equipment to check those) and write them all down. When your visit starts, your provider will ask you for this information. If you have an Apple Watch or Kardia device, please plan to have heart rate information ready on the day of your appointment. Please have a pen and paper handy nearby the day of the visit as well."  5. Give patient instructions for MyChart download to smartphone OR [email or web address] as below if video visit (depending on what platform provider is using)  6. Inform patient they will receive a phone call 15 minutes prior to their appointment time (may be from unknown caller ID) so they should be prepared to answer    TELEPHONE CALL NOTE  Noah Mccarthy has been deemed a candidate for a follow-up tele-health visit to limit community exposure during the Covid-19 pandemic. I spoke with the patient via phone to ensure availability of phone/video source, confirm preferred email & phone number, and discuss instructions and expectations.  I reminded Noah Mccarthy to be prepared with any vital sign and/or heart rhythm information that could potentially be obtained via home monitoring, at the time of his visit. I reminded Noah Mccarthy to expect a phone call prior to  his visit.  Noah Mccarthy 05/03/2019 10:33 AM

## 2019-05-07 ENCOUNTER — Telehealth (INDEPENDENT_AMBULATORY_CARE_PROVIDER_SITE_OTHER): Payer: Medicare Other | Admitting: Cardiology

## 2019-05-07 ENCOUNTER — Other Ambulatory Visit: Payer: Self-pay

## 2019-05-07 ENCOUNTER — Encounter: Payer: Self-pay | Admitting: Cardiology

## 2019-05-07 VITALS — BP 110/68 | HR 94 | Ht 71.0 in | Wt 238.0 lb

## 2019-05-07 DIAGNOSIS — I10 Essential (primary) hypertension: Secondary | ICD-10-CM

## 2019-05-07 DIAGNOSIS — I480 Paroxysmal atrial fibrillation: Secondary | ICD-10-CM

## 2019-05-07 DIAGNOSIS — E782 Mixed hyperlipidemia: Secondary | ICD-10-CM

## 2019-05-07 DIAGNOSIS — R0789 Other chest pain: Secondary | ICD-10-CM

## 2019-05-07 NOTE — Patient Instructions (Signed)
Medication Instructions:  Decrease lisinopril to 5 mg daily for next week, then call to update [place] on bp's   Labwork: none  Testing/Procedures: none  Follow-Up: Your physician wants you to follow-up in: 6 months.  You will receive a reminder letter in the mail two months in advance. If you don't receive a letter, please call our office to schedule the follow-up appointment.   Any Other Special Instructions Will Be Listed Below (If Applicable).     If you need a refill on your cardiac medications before your next appointment, please call your pharmacy.

## 2019-05-07 NOTE — Progress Notes (Signed)
Virtual Visit via Telephone Note   This visit type was conducted due to national recommendations for restrictions regarding the COVID-19 Pandemic (e.g. social distancing) in an effort to limit this patient's exposure and mitigate transmission in our community.  Due to his co-morbid illnesses, this patient is at least at moderate risk for complications without adequate follow up.  This format is felt to be most appropriate for this patient at this time.  The patient did not have access to video technology/had technical difficulties with video requiring transitioning to audio format only (telephone).  All issues noted in this document were discussed and addressed.  No physical exam could be performed with this format.  Please refer to the patient's chart for his  consent to telehealth for [facility].   Date:  05/07/2019   ID:  Noah Mccarthy, DOB 09/20/1941, MRN [id number]  Patient Location: Home Provider Location: Home  PCP:  Manon Hilding, MD  Cardiologist:  Dr Carlyle Dolly MD  Electrophysiologist:  None   Evaluation Performed:  Follow-Up Visit  Chief Complaint:  Follow up  History of Present Illness:    Noah Mccarthy is a 78 y.o. male seen today for follow up of the following medical problems.  1. History of chest pain - 08/2015 exercise nuclear stress showed no ST/T changes, poor exercise tolerance, normal perfusion with no ischemia.   - no recent symptoms.   2. Afib - CHADS2Vasc score of 1, he has elected for no anticoag - lopressor has caused fatigue in the past, has been managed with dilt prn only. - we tried daily dilt 30mg  bid, phone call 02/2018 about fatigue and we changed to prn, though he does not recall this - taking dilt just prn, on average about once a onth   3. Hyperlipidemia - compliant with statin, labs followed by pcp - 02/2019 TC 162 HDL 38 LDL 99  4. HTN - has been on lisinopril 10mg  by pcp - checking bp's multiple times a day. Typically  110s/50s on lisinpril. Can feel sluggish at times. Reports bp often is elevated first thing in the morning prior to taking meds    The patient does not have symptoms concerning for COVID-19 infection (fever, chills, cough, or new shortness of breath).    Past Medical History:  Diagnosis Date  . Chest tightness    With rapid atrial fibrillation, June, 2013, stress echo normal  . Ejection fraction    EF 60%, echo, June, 2013  . Hyperlipidemia   . Paroxysmal atrial fibrillation ([place])    Hospitalization in June, 2013, spontaneous conversion to normal sinus rhythm, CHADS score is 0, therefore anticoagulation has not been recommended  . Prostate cancer ([place])   . Sinus tachycardia    Mild, office , December, 2013, no symptoms   Past Surgical History:  Procedure Laterality Date  . PROSTATE SURGERY     For prostate cancer     No outpatient medications have been marked as taking for the 05/07/19 encounter (Appointment) with Arnoldo Lenis, MD.     Allergies:   Patient has no known allergies.   Social History   Tobacco Use  . Smoking status: Never Smoker  . Smokeless tobacco: Never Used  Substance Use Topics  . Alcohol use: Yes    Alcohol/week: 0.0 standard drinks    Comment: 1 beverage every night for dinner  . Drug use: No     Family Hx: The patient's family history includes Heart attack (age of onset:  75) in his brother.  ROS:   Please see the history of present illness.     All other systems reviewed and are negative.   Prior CV studies:   The following studies were reviewed today:  08/2015 Exercise MPI  Blood pressure demonstrated a hypertensive response to exercise.  There was no ST segment deviation noted during stress.  Poor exercise tolerance with Duke treadmill score of 3 portends a moderate risk for cardiac events. Clinical correlation indicated.  Normal myocardial perfusion.  Nuclear stress EF: 75%.   03/2012 echo LVEF 75%, grade I diastolic  dysfunction  Labs/Other Tests and Data Reviewed:    EKG:  No ECG reviewed.  Recent Labs: No results found for requested labs within last 8760 hours.   Recent Lipid Panel No results found for: CHOL, TRIG, HDL, CHOLHDL, LDLCALC, LDLDIRECT  Wt Readings from Last 3 Encounters:  12/26/17 241 lb (109.3 kg)  01/09/17 233 lb 3.2 oz (105.8 kg)  01/06/16 230 lb (104.3 kg)     Objective:    Vital Signs:  There were no vitals taken for this visit.   Today's Vitals   05/07/19 0808  BP: 110/68  Pulse: 94  Weight: 238 lb (108 kg)  Height: 5\' 11"  (1.803 m)   Body mass index is 33.19 kg/m.  Normal affect. Normal speech pattern and tone. Comfortable, no apparent distress. No audible signs of SOB or wheezing.   ASSESSMENT & PLAN:    1. Chest pain - negative exercise nuclear stress 08/2015 - denies any recent symptoms, continue current meds  2. Afib -has had some issues with fatigue on av nodal agents, currnently just on prn dilt which seems to be controlling his symptoms - continue current meds  3. Hyperlipidmeia - at goal, continue statin  4. HTN - recently started on lisinopril by pcp - home bp data a little off as he checks first thing in the morning prior to taking meds - he reports some sluggishnes she thinks is coming from low bp - he will try taking lisinopril 5mg  daily, check bp's no earlier than noon, and call us in 1 week. Will not change Rx for lisinopril yet until we here his numbers next week  COVID-19 Education: The signs and symptoms of COVID-19 were discussed with the patient and how to seek care for testing (follow up with PCP or arrange E-visit).  The importance of social distancing was discussed today.  Time:   Today, I have spent 18 minutes with the patient with telehealth technology discussing the above problems.     Medication Adjustments/Labs and Tests Ordered: Current medicines are reviewed at length with the patient today.  Concerns regarding  medicines are outlined above.   Tests Ordered: No orders of the defined types were placed in this encounter.   Medication Changes: No orders of the defined types were placed in this encounter.   Follow Up:  In Person in 6 month(s)  Signed, Carlyle Dolly, MD  05/07/2019 8:05 AM    [place]

## 2019-05-14 ENCOUNTER — Telehealth: Payer: Self-pay | Admitting: *Deleted

## 2019-05-14 NOTE — Telephone Encounter (Signed)
Patient informed and verbalized understanding. Patient request that rx stay at 10 mg and that he will break in half daily.

## 2019-05-14 NOTE — Telephone Encounter (Signed)
Bp's look fine, we can officiially change his lisinopril to 5mg  daily, needs new Rx. Last visit he was just to try the lower dose and update [place], I don't believe a new Rx was sent   Zandra Abts MD

## 2019-05-14 NOTE — Telephone Encounter (Signed)
Lisinopril dose decreased at last virtual visit to 5 mg daily.   06/24  120/74  95 06/25  113/69  107 06/26  119/79  98 06/27  117/68  68 06/28  124/81  71 06/29  131/87  101 06/30  121/70  93

## 2019-05-31 ENCOUNTER — Telehealth: Payer: Self-pay | Admitting: *Deleted

## 2019-05-31 NOTE — Telephone Encounter (Signed)
Called to notify pt that patient assistance Eliquis has arrivied in office. No answer. Left msg to call back.

## 2019-08-20 DIAGNOSIS — R7301 Impaired fasting glucose: Secondary | ICD-10-CM | POA: Diagnosis not present

## 2019-08-20 DIAGNOSIS — I10 Essential (primary) hypertension: Secondary | ICD-10-CM | POA: Diagnosis not present

## 2019-08-20 DIAGNOSIS — E782 Mixed hyperlipidemia: Secondary | ICD-10-CM | POA: Diagnosis not present

## 2019-08-27 DIAGNOSIS — Z8546 Personal history of malignant neoplasm of prostate: Secondary | ICD-10-CM | POA: Diagnosis not present

## 2019-08-27 DIAGNOSIS — I4891 Unspecified atrial fibrillation: Secondary | ICD-10-CM | POA: Diagnosis not present

## 2019-08-27 DIAGNOSIS — Z23 Encounter for immunization: Secondary | ICD-10-CM | POA: Diagnosis not present

## 2019-08-27 DIAGNOSIS — E782 Mixed hyperlipidemia: Secondary | ICD-10-CM | POA: Diagnosis not present

## 2019-08-27 DIAGNOSIS — R5383 Other fatigue: Secondary | ICD-10-CM | POA: Diagnosis not present

## 2019-08-27 DIAGNOSIS — Z0001 Encounter for general adult medical examination with abnormal findings: Secondary | ICD-10-CM | POA: Diagnosis not present

## 2019-08-27 DIAGNOSIS — D519 Vitamin B12 deficiency anemia, unspecified: Secondary | ICD-10-CM | POA: Diagnosis not present

## 2019-08-27 DIAGNOSIS — Z6835 Body mass index (BMI) 35.0-35.9, adult: Secondary | ICD-10-CM | POA: Diagnosis not present

## 2019-10-31 ENCOUNTER — Telehealth: Payer: Self-pay | Admitting: Cardiology

## 2019-10-31 NOTE — Telephone Encounter (Signed)
Patient read that side effects of both Eliquis and Diltiazem cause dizziness. Describes as "slight sea sickness" after he takes meds with breakfast.Symptoms resolve by lunch.Then at night, when he lays his head on the pillow, he experiences a "good 2 minutes of full dizziness" which then resolves. Both sx's began around 3 weeks ago.   Of note, he takes Aleve 200 mg every night for his shoulder pain.I told him to avoid NSAIDS while taking eliquis.

## 2019-10-31 NOTE — Telephone Encounter (Signed)
His note is written as just prn, is he taking daily? How often if he taking? Eliquis is unlikely to be the culprit, perhaps the dilt depending on how often he is taking it   Zandra Abts MD

## 2019-10-31 NOTE — Telephone Encounter (Signed)
Pt is having dizziness and a sick feeling- PCP told pt it maybe coming from his diltiazem (CARDIZEM) 30 MG tablet [id number]

## 2019-11-01 NOTE — Telephone Encounter (Signed)
Patient will take diltiazem only for palpitations and call us back next week.

## 2019-11-01 NOTE — Telephone Encounter (Signed)
Noah Mccarthy insists he was told to go ahead and take diltiazem twice a day, which he has been doing.

## 2019-11-01 NOTE — Telephone Encounter (Signed)
Try taking the diltiazem only as needed for palpitatoins and update [place] on symptoms next week   Zandra Abts MD

## 2019-11-05 ENCOUNTER — Telehealth: Payer: Self-pay | Admitting: *Deleted

## 2019-11-05 NOTE — Telephone Encounter (Signed)
Pt notified that pt assistance Eliquis has arrived in office.  °

## 2019-12-13 DIAGNOSIS — I10 Essential (primary) hypertension: Secondary | ICD-10-CM | POA: Diagnosis not present

## 2019-12-13 DIAGNOSIS — E7849 Other hyperlipidemia: Secondary | ICD-10-CM | POA: Diagnosis not present

## 2020-01-10 DIAGNOSIS — E7849 Other hyperlipidemia: Secondary | ICD-10-CM | POA: Diagnosis not present

## 2020-01-10 DIAGNOSIS — I10 Essential (primary) hypertension: Secondary | ICD-10-CM | POA: Diagnosis not present

## 2020-01-17 DIAGNOSIS — C61 Malignant neoplasm of prostate: Secondary | ICD-10-CM | POA: Diagnosis not present

## 2020-02-12 DIAGNOSIS — E539 Vitamin B deficiency, unspecified: Secondary | ICD-10-CM | POA: Diagnosis not present

## 2020-02-12 DIAGNOSIS — I482 Chronic atrial fibrillation, unspecified: Secondary | ICD-10-CM | POA: Diagnosis not present

## 2020-02-12 DIAGNOSIS — D510 Vitamin B12 deficiency anemia due to intrinsic factor deficiency: Secondary | ICD-10-CM | POA: Diagnosis not present

## 2020-02-12 DIAGNOSIS — R7301 Impaired fasting glucose: Secondary | ICD-10-CM | POA: Diagnosis not present

## 2020-02-12 DIAGNOSIS — E782 Mixed hyperlipidemia: Secondary | ICD-10-CM | POA: Diagnosis not present

## 2020-02-12 DIAGNOSIS — L570 Actinic keratosis: Secondary | ICD-10-CM | POA: Diagnosis not present

## 2020-02-12 DIAGNOSIS — E7800 Pure hypercholesterolemia, unspecified: Secondary | ICD-10-CM | POA: Diagnosis not present

## 2020-02-12 DIAGNOSIS — I10 Essential (primary) hypertension: Secondary | ICD-10-CM | POA: Diagnosis not present

## 2020-02-13 DIAGNOSIS — H25813 Combined forms of age-related cataract, bilateral: Secondary | ICD-10-CM | POA: Diagnosis not present

## 2020-02-13 DIAGNOSIS — H353132 Nonexudative age-related macular degeneration, bilateral, intermediate dry stage: Secondary | ICD-10-CM | POA: Diagnosis not present

## 2020-02-18 DIAGNOSIS — Z6832 Body mass index (BMI) 32.0-32.9, adult: Secondary | ICD-10-CM | POA: Diagnosis not present

## 2020-02-18 DIAGNOSIS — R5383 Other fatigue: Secondary | ICD-10-CM | POA: Diagnosis not present

## 2020-02-18 DIAGNOSIS — R739 Hyperglycemia, unspecified: Secondary | ICD-10-CM | POA: Diagnosis not present

## 2020-02-18 DIAGNOSIS — E782 Mixed hyperlipidemia: Secondary | ICD-10-CM | POA: Diagnosis not present

## 2020-02-18 DIAGNOSIS — D519 Vitamin B12 deficiency anemia, unspecified: Secondary | ICD-10-CM | POA: Diagnosis not present

## 2020-02-18 DIAGNOSIS — Z8546 Personal history of malignant neoplasm of prostate: Secondary | ICD-10-CM | POA: Diagnosis not present

## 2020-03-13 DIAGNOSIS — E7849 Other hyperlipidemia: Secondary | ICD-10-CM | POA: Diagnosis not present

## 2020-03-13 DIAGNOSIS — I10 Essential (primary) hypertension: Secondary | ICD-10-CM | POA: Diagnosis not present

## 2020-04-13 DIAGNOSIS — I482 Chronic atrial fibrillation, unspecified: Secondary | ICD-10-CM | POA: Diagnosis not present

## 2020-04-13 DIAGNOSIS — I10 Essential (primary) hypertension: Secondary | ICD-10-CM | POA: Diagnosis not present

## 2020-04-13 DIAGNOSIS — E7849 Other hyperlipidemia: Secondary | ICD-10-CM | POA: Diagnosis not present

## 2020-07-14 DIAGNOSIS — I10 Essential (primary) hypertension: Secondary | ICD-10-CM | POA: Diagnosis not present

## 2020-07-14 DIAGNOSIS — I482 Chronic atrial fibrillation, unspecified: Secondary | ICD-10-CM | POA: Diagnosis not present

## 2020-07-14 DIAGNOSIS — E7849 Other hyperlipidemia: Secondary | ICD-10-CM | POA: Diagnosis not present

## 2020-07-21 DIAGNOSIS — Z20828 Contact with and (suspected) exposure to other viral communicable diseases: Secondary | ICD-10-CM | POA: Diagnosis not present

## 2020-07-25 DIAGNOSIS — Z03818 Encounter for observation for suspected exposure to other biological agents ruled out: Secondary | ICD-10-CM | POA: Diagnosis not present

## 2020-08-11 ENCOUNTER — Other Ambulatory Visit: Payer: Self-pay

## 2020-08-11 ENCOUNTER — Encounter: Payer: Self-pay | Admitting: Cardiology

## 2020-08-11 ENCOUNTER — Ambulatory Visit (INDEPENDENT_AMBULATORY_CARE_PROVIDER_SITE_OTHER): Payer: Medicare Other | Admitting: Cardiology

## 2020-08-11 VITALS — BP 132/80 | HR 90 | Ht 71.0 in | Wt 234.0 lb

## 2020-08-11 DIAGNOSIS — E782 Mixed hyperlipidemia: Secondary | ICD-10-CM | POA: Diagnosis not present

## 2020-08-11 DIAGNOSIS — I480 Paroxysmal atrial fibrillation: Secondary | ICD-10-CM | POA: Diagnosis not present

## 2020-08-11 DIAGNOSIS — R0789 Other chest pain: Secondary | ICD-10-CM | POA: Diagnosis not present

## 2020-08-11 NOTE — Patient Instructions (Signed)
Medication Instructions:  STOP Aspirin   *If you need a refill on your cardiac medications before your next appointment, please call your pharmacy*   Lab Work: None today If you have labs (blood work) drawn today and your tests are completely normal, you will receive your results only by: Marland Kitchen MyChart Message (if you have MyChart) OR . A paper copy in the mail If you have any lab test that is abnormal or we need to change your treatment, we will call you to review the results.   Testing/Procedures: None today   Follow-Up: At [facility], you and your health needs are our priority.  As part of our continuing mission to provide you with exceptional heart care, we have created designated Provider Care Teams.  These Care Teams include your primary Cardiologist (physician) and Advanced Practice Providers (APPs -  Physician Assistants and Nurse Practitioners) who all work together to provide you with the care you need, when you need it.  We recommend signing up for the patient portal called "MyChart".  Sign up information is provided on this After Visit Summary.  MyChart is used to connect with patients for Virtual Visits (Telemedicine).  Patients are able to view lab/test results, encounter notes, upcoming appointments, etc.  Non-urgent messages can be sent to your provider as well.   To learn more about what you can do with MyChart, go to [email or web address].    Your next appointment:   6 month(s)  The format for your next appointment:   In Person  Provider:   Carlyle Dolly, MD   Other Instructions None      Thank you for choosing [facility] !

## 2020-08-11 NOTE — Progress Notes (Signed)
Clinical Summary Noah Mccarthy is a 79 y.o.maleseen today for follow up of the following medical problems.  1. History of chest pain - 08/2015 exercise nuclear stress showed no ST/T changes, poor exercise tolerance, normal perfusion with no ischemia.   - no recent symptoms.   2. Afib - CHADS2Vasc score of 1, he has elected for no anticoag - lopressor has caused fatigue in the past, has been managed with dilt prn only. - we tried daily dilt 30mg  bid, phone call 02/2018 about fatigue and we changed to prn, though he does not recall this  - no recent palpitations - taking diltiazem just prn, rarely needs   3. Hyperlipidemia -compliant with statin, labs followed by pcp - 02/2019 TC 162 HDL 38 LDL 99  - labs followed by pcp  4. HTN - he is off lisinopril, bps look fine.   5. Generalized fatigue - naps often.  +snoring, +daytime somnolence.    [place]: Has a fishing place in [place], just got back from trip. Past Medical History:  Diagnosis Date  . Chest tightness    With rapid atrial fibrillation, June, 2013, stress echo normal  . Ejection fraction    EF 60%, echo, June, 2013  . Hyperlipidemia   . Paroxysmal atrial fibrillation ([place])    Hospitalization in June, 2013, spontaneous conversion to normal sinus rhythm, CHADS score is 0, therefore anticoagulation has not been recommended  . Prostate cancer ([place])   . Sinus tachycardia    Mild, office , December, 2013, no symptoms     No Known Allergies   Current Outpatient Medications  Medication Sig Dispense Refill  . apixaban (ELIQUIS) 5 MG TABS tablet Take 1 tablet (5 mg total) by mouth 2 (two) times daily. 180 tablet 3  . aspirin 81 MG tablet Take 81 mg by mouth daily.    Marland Kitchen atorvastatin (LIPITOR) 10 MG tablet Take 10 mg by mouth every other day.    . diltiazem (CARDIZEM) 30 MG tablet Take 1 tablet (30 mg total) by mouth 2 (two) times daily as needed (Palpitations). 60 tablet 0  . lisinopril (ZESTRIL) 10 MG tablet  Take 5 mg by mouth daily.      No current facility-administered medications for this visit.     Past Surgical History:  Procedure Laterality Date  . PROSTATE SURGERY     For prostate cancer     No Known Allergies    Family History  Problem Relation Age of Onset  . Heart attack Brother 60       cause of death     Social History Noah Mccarthy reports that he has never smoked. He has never used smokeless tobacco. Noah Mccarthy reports current alcohol use.   Review of Systems CONSTITUTIONAL: +generalized fatigue HEENT: Eyes: No visual loss, blurred vision, double vision or yellow sclerae.No hearing loss, sneezing, congestion, runny nose or sore throat.  SKIN: No rash or itching.  CARDIOVASCULAR: per hpi RESPIRATORY: No shortness of breath, cough or sputum.  GASTROINTESTINAL: No anorexia, nausea, vomiting or diarrhea. No abdominal pain or blood.  GENITOURINARY: No burning on urination, no polyuria NEUROLOGICAL: No headache, dizziness, syncope, paralysis, ataxia, numbness or tingling in the extremities. No change in bowel or bladder control.  MUSCULOSKELETAL: No muscle, back pain, joint pain or stiffness.  LYMPHATICS: No enlarged nodes. No history of splenectomy.  PSYCHIATRIC: No history of depression or anxiety.  ENDOCRINOLOGIC: No reports of sweating, cold or heat intolerance. No polyuria or polydipsia.  Marland Kitchen  Physical Examination Today's Vitals   08/11/20 0828  BP: 132/80  Pulse: 90  SpO2: 97%  Weight: 234 lb (106.1 kg)  Height: 5\' 11"  (1.803 m)   Body mass index is 32.64 kg/m.  Gen: resting comfortably, no acute distress HEENT: no scleral icterus, pupils equal round and reactive, no palptable cervical adenopathy,  CV: RRR, no m/r/g, no jvd Resp: Clear to auscultation bilaterally GI: abdomen is soft, non-tender, non-distended, normal bowel sounds, no hepatosplenomegaly MSK: extremities are warm, no edema.  Skin: warm, no rash Neuro:  no focal deficits Psych:  appropriate affect   Diagnostic Studies 08/2015 Exercise MPI  Blood pressure demonstrated a hypertensive response to exercise.  There was no ST segment deviation noted during stress.  Poor exercise tolerance with Duke treadmill score of 3 portends a moderate risk for cardiac events. Clinical correlation indicated.  Normal myocardial perfusion.  Nuclear stress EF: 75%.   03/2012 echo LVEF 99%, grade I diastolic dysfunction    Assessment and Plan   1. Chest pain - negative exercise nuclear stress 08/2015 - no recent symptoms, continue to monitor.   2. Afib -has had some issues with fatigue on av nodal agents, currnently just on prn dilt which seems to be controlling his symptoms - we will continue current meds, can stop ASA since on eliquis.   3. Hyperlipidmeia - continue statin, request pcp labs  4. Generalized fatigue - unclear etiology, some day time somnolence - discussed possible sleep study, he will consider and let us know  F/u 6 months      Arnoldo Lenis, M.D.

## 2020-08-13 DIAGNOSIS — I482 Chronic atrial fibrillation, unspecified: Secondary | ICD-10-CM | POA: Diagnosis not present

## 2020-08-13 DIAGNOSIS — I10 Essential (primary) hypertension: Secondary | ICD-10-CM | POA: Diagnosis not present

## 2020-08-13 DIAGNOSIS — E7849 Other hyperlipidemia: Secondary | ICD-10-CM | POA: Diagnosis not present

## 2020-08-28 DIAGNOSIS — Z7184 Encounter for health counseling related to travel: Secondary | ICD-10-CM | POA: Diagnosis not present

## 2020-09-12 DIAGNOSIS — E7849 Other hyperlipidemia: Secondary | ICD-10-CM | POA: Diagnosis not present

## 2020-09-12 DIAGNOSIS — I482 Chronic atrial fibrillation, unspecified: Secondary | ICD-10-CM | POA: Diagnosis not present

## 2020-09-12 DIAGNOSIS — I10 Essential (primary) hypertension: Secondary | ICD-10-CM | POA: Diagnosis not present

## 2020-09-17 DIAGNOSIS — E782 Mixed hyperlipidemia: Secondary | ICD-10-CM | POA: Diagnosis not present

## 2020-09-17 DIAGNOSIS — R5383 Other fatigue: Secondary | ICD-10-CM | POA: Diagnosis not present

## 2020-09-17 DIAGNOSIS — I10 Essential (primary) hypertension: Secondary | ICD-10-CM | POA: Diagnosis not present

## 2020-09-17 DIAGNOSIS — E7800 Pure hypercholesterolemia, unspecified: Secondary | ICD-10-CM | POA: Diagnosis not present

## 2020-09-17 DIAGNOSIS — D519 Vitamin B12 deficiency anemia, unspecified: Secondary | ICD-10-CM | POA: Diagnosis not present

## 2020-09-17 DIAGNOSIS — R739 Hyperglycemia, unspecified: Secondary | ICD-10-CM | POA: Diagnosis not present

## 2020-09-21 DIAGNOSIS — Z23 Encounter for immunization: Secondary | ICD-10-CM | POA: Diagnosis not present

## 2020-09-21 DIAGNOSIS — Z0001 Encounter for general adult medical examination with abnormal findings: Secondary | ICD-10-CM | POA: Diagnosis not present

## 2020-09-21 DIAGNOSIS — E782 Mixed hyperlipidemia: Secondary | ICD-10-CM | POA: Diagnosis not present

## 2020-09-21 DIAGNOSIS — D519 Vitamin B12 deficiency anemia, unspecified: Secondary | ICD-10-CM | POA: Diagnosis not present

## 2020-09-21 DIAGNOSIS — Z6833 Body mass index (BMI) 33.0-33.9, adult: Secondary | ICD-10-CM | POA: Diagnosis not present

## 2020-09-21 DIAGNOSIS — Z8546 Personal history of malignant neoplasm of prostate: Secondary | ICD-10-CM | POA: Diagnosis not present

## 2020-09-21 DIAGNOSIS — R5383 Other fatigue: Secondary | ICD-10-CM | POA: Diagnosis not present

## 2020-10-12 DIAGNOSIS — Z23 Encounter for immunization: Secondary | ICD-10-CM | POA: Diagnosis not present

## 2020-10-29 DIAGNOSIS — C61 Malignant neoplasm of prostate: Secondary | ICD-10-CM | POA: Diagnosis not present

## 2020-10-29 DIAGNOSIS — Z9079 Acquired absence of other genital organ(s): Secondary | ICD-10-CM | POA: Diagnosis not present

## 2020-10-29 DIAGNOSIS — N5231 Erectile dysfunction following radical prostatectomy: Secondary | ICD-10-CM | POA: Diagnosis not present

## 2020-10-29 DIAGNOSIS — N393 Stress incontinence (female) (male): Secondary | ICD-10-CM | POA: Diagnosis not present

## 2020-11-11 ENCOUNTER — Telehealth: Payer: Self-pay | Admitting: Cardiology

## 2020-11-11 NOTE — Telephone Encounter (Signed)
Patient called [facility] -office requesting to see if samples have been mailed to [place] office of Eliquis.  Please contact patient.

## 2020-11-11 NOTE — Telephone Encounter (Signed)
Patient was not requesting samples, he wanted to know if he needed to fill out another patient assistance form. Will call back and let him know

## 2020-11-13 DIAGNOSIS — I482 Chronic atrial fibrillation, unspecified: Secondary | ICD-10-CM | POA: Diagnosis not present

## 2020-11-13 DIAGNOSIS — I10 Essential (primary) hypertension: Secondary | ICD-10-CM | POA: Diagnosis not present

## 2020-11-13 DIAGNOSIS — E7849 Other hyperlipidemia: Secondary | ICD-10-CM | POA: Diagnosis not present

## 2020-12-12 DIAGNOSIS — I482 Chronic atrial fibrillation, unspecified: Secondary | ICD-10-CM | POA: Diagnosis not present

## 2020-12-12 DIAGNOSIS — E7849 Other hyperlipidemia: Secondary | ICD-10-CM | POA: Diagnosis not present

## 2020-12-12 DIAGNOSIS — I10 Essential (primary) hypertension: Secondary | ICD-10-CM | POA: Diagnosis not present

## 2020-12-29 ENCOUNTER — Telehealth: Payer: Self-pay | Admitting: Cardiology

## 2020-12-29 NOTE — Telephone Encounter (Signed)
Pt called wanting to know if anyone has heard anything regarding his assistance paperwork, stated he dropped it off a couple of weeks ago at the [place] office   He's almost out of his apixaban (ELIQUIS) 5 MG TABS tablet [[id number]]  And is needing samples.   Please call [phone]

## 2020-12-29 NOTE — Telephone Encounter (Signed)
Pt notified that [employer] patient assistance is still working on his application. 2 Sample boxes placed at front desk for pick up.

## 2020-12-29 NOTE — Telephone Encounter (Signed)
Returned call to pt. No answer, unable to leave msg.

## 2021-01-11 DIAGNOSIS — I482 Chronic atrial fibrillation, unspecified: Secondary | ICD-10-CM | POA: Diagnosis not present

## 2021-01-11 DIAGNOSIS — I10 Essential (primary) hypertension: Secondary | ICD-10-CM | POA: Diagnosis not present

## 2021-01-11 DIAGNOSIS — E7849 Other hyperlipidemia: Secondary | ICD-10-CM | POA: Diagnosis not present

## 2021-01-18 ENCOUNTER — Telehealth: Payer: Self-pay | Admitting: *Deleted

## 2021-01-18 NOTE — Telephone Encounter (Signed)
Pt in office requesting samples of Eliquis d/t patient assistance not being approved at this time. Pt given 2 sample boxes of Eliquis 5 mg. Lot # [id number], Exp: Apr 2024.

## 2021-01-20 ENCOUNTER — Telehealth: Payer: Self-pay | Admitting: *Deleted

## 2021-01-20 NOTE — Telephone Encounter (Signed)
Pt notified that he has been approved for the [employer] patient assistance foundation for the rest to this year. Pt notified and voiced understanding.

## 2021-02-10 DIAGNOSIS — Z85828 Personal history of other malignant neoplasm of skin: Secondary | ICD-10-CM | POA: Diagnosis not present

## 2021-02-10 DIAGNOSIS — L218 Other seborrheic dermatitis: Secondary | ICD-10-CM | POA: Diagnosis not present

## 2021-02-10 DIAGNOSIS — Z1283 Encounter for screening for malignant neoplasm of skin: Secondary | ICD-10-CM | POA: Diagnosis not present

## 2021-02-10 DIAGNOSIS — L309 Dermatitis, unspecified: Secondary | ICD-10-CM | POA: Diagnosis not present

## 2021-02-10 DIAGNOSIS — L570 Actinic keratosis: Secondary | ICD-10-CM | POA: Diagnosis not present

## 2021-02-15 DIAGNOSIS — H25813 Combined forms of age-related cataract, bilateral: Secondary | ICD-10-CM | POA: Diagnosis not present

## 2021-02-15 DIAGNOSIS — H353132 Nonexudative age-related macular degeneration, bilateral, intermediate dry stage: Secondary | ICD-10-CM | POA: Diagnosis not present

## 2021-02-15 DIAGNOSIS — H524 Presbyopia: Secondary | ICD-10-CM | POA: Diagnosis not present

## 2021-02-24 DIAGNOSIS — I10 Essential (primary) hypertension: Secondary | ICD-10-CM | POA: Diagnosis not present

## 2021-02-24 DIAGNOSIS — E7849 Other hyperlipidemia: Secondary | ICD-10-CM | POA: Diagnosis not present

## 2021-02-24 DIAGNOSIS — E782 Mixed hyperlipidemia: Secondary | ICD-10-CM | POA: Diagnosis not present

## 2021-02-24 DIAGNOSIS — R7301 Impaired fasting glucose: Secondary | ICD-10-CM | POA: Diagnosis not present

## 2021-02-24 DIAGNOSIS — E7800 Pure hypercholesterolemia, unspecified: Secondary | ICD-10-CM | POA: Diagnosis not present

## 2021-02-24 DIAGNOSIS — Z8546 Personal history of malignant neoplasm of prostate: Secondary | ICD-10-CM | POA: Diagnosis not present

## 2021-02-24 DIAGNOSIS — E7801 Familial hypercholesterolemia: Secondary | ICD-10-CM | POA: Diagnosis not present

## 2021-02-24 DIAGNOSIS — D519 Vitamin B12 deficiency anemia, unspecified: Secondary | ICD-10-CM | POA: Diagnosis not present

## 2021-03-01 DIAGNOSIS — R5383 Other fatigue: Secondary | ICD-10-CM | POA: Diagnosis not present

## 2021-03-01 DIAGNOSIS — E782 Mixed hyperlipidemia: Secondary | ICD-10-CM | POA: Diagnosis not present

## 2021-03-01 DIAGNOSIS — Z1389 Encounter for screening for other disorder: Secondary | ICD-10-CM | POA: Diagnosis not present

## 2021-03-01 DIAGNOSIS — R739 Hyperglycemia, unspecified: Secondary | ICD-10-CM | POA: Diagnosis not present

## 2021-03-01 DIAGNOSIS — Z8546 Personal history of malignant neoplasm of prostate: Secondary | ICD-10-CM | POA: Diagnosis not present

## 2021-03-01 DIAGNOSIS — Z1331 Encounter for screening for depression: Secondary | ICD-10-CM | POA: Diagnosis not present

## 2021-03-01 DIAGNOSIS — D519 Vitamin B12 deficiency anemia, unspecified: Secondary | ICD-10-CM | POA: Diagnosis not present

## 2021-03-16 DIAGNOSIS — L609 Nail disorder, unspecified: Secondary | ICD-10-CM | POA: Diagnosis not present

## 2021-03-16 DIAGNOSIS — M79672 Pain in left foot: Secondary | ICD-10-CM | POA: Diagnosis not present

## 2021-03-16 DIAGNOSIS — M79671 Pain in right foot: Secondary | ICD-10-CM | POA: Diagnosis not present

## 2021-03-16 DIAGNOSIS — M79674 Pain in right toe(s): Secondary | ICD-10-CM | POA: Diagnosis not present

## 2021-03-16 DIAGNOSIS — I739 Peripheral vascular disease, unspecified: Secondary | ICD-10-CM | POA: Diagnosis not present

## 2021-03-16 DIAGNOSIS — M79675 Pain in left toe(s): Secondary | ICD-10-CM | POA: Diagnosis not present

## 2021-04-12 DIAGNOSIS — I482 Chronic atrial fibrillation, unspecified: Secondary | ICD-10-CM | POA: Diagnosis not present

## 2021-04-12 DIAGNOSIS — E7849 Other hyperlipidemia: Secondary | ICD-10-CM | POA: Diagnosis not present

## 2021-04-12 DIAGNOSIS — I10 Essential (primary) hypertension: Secondary | ICD-10-CM | POA: Diagnosis not present

## 2021-04-15 DIAGNOSIS — Z23 Encounter for immunization: Secondary | ICD-10-CM | POA: Diagnosis not present

## 2021-05-13 DIAGNOSIS — I10 Essential (primary) hypertension: Secondary | ICD-10-CM | POA: Diagnosis not present

## 2021-05-13 DIAGNOSIS — I482 Chronic atrial fibrillation, unspecified: Secondary | ICD-10-CM | POA: Diagnosis not present

## 2021-05-13 DIAGNOSIS — E7849 Other hyperlipidemia: Secondary | ICD-10-CM | POA: Diagnosis not present

## 2021-05-25 DIAGNOSIS — M79675 Pain in left toe(s): Secondary | ICD-10-CM | POA: Diagnosis not present

## 2021-05-25 DIAGNOSIS — L609 Nail disorder, unspecified: Secondary | ICD-10-CM | POA: Diagnosis not present

## 2021-05-25 DIAGNOSIS — I739 Peripheral vascular disease, unspecified: Secondary | ICD-10-CM | POA: Diagnosis not present

## 2021-05-25 DIAGNOSIS — M79672 Pain in left foot: Secondary | ICD-10-CM | POA: Diagnosis not present

## 2021-05-25 DIAGNOSIS — M79671 Pain in right foot: Secondary | ICD-10-CM | POA: Diagnosis not present

## 2021-05-25 DIAGNOSIS — M79674 Pain in right toe(s): Secondary | ICD-10-CM | POA: Diagnosis not present

## 2021-07-16 ENCOUNTER — Ambulatory Visit (INDEPENDENT_AMBULATORY_CARE_PROVIDER_SITE_OTHER): Payer: Medicare Other | Admitting: Cardiology

## 2021-07-16 ENCOUNTER — Encounter: Payer: Self-pay | Admitting: Cardiology

## 2021-07-16 VITALS — BP 142/90 | HR 86 | Ht 71.0 in | Wt 228.8 lb

## 2021-07-16 DIAGNOSIS — I480 Paroxysmal atrial fibrillation: Secondary | ICD-10-CM | POA: Diagnosis not present

## 2021-07-16 DIAGNOSIS — E782 Mixed hyperlipidemia: Secondary | ICD-10-CM

## 2021-07-16 DIAGNOSIS — R0789 Other chest pain: Secondary | ICD-10-CM | POA: Diagnosis not present

## 2021-07-16 NOTE — Patient Instructions (Signed)
Medication Instructions:  Your physician recommends that you continue on your current medications as directed. Please refer to the Current Medication list given to you today.  *If you need a refill on your cardiac medications before your next appointment, please call your pharmacy*   Lab Work: None today If you have labs (blood work) drawn today and your tests are completely normal, you will receive your results only by: MyChart Message (if you have MyChart) OR A paper copy in the mail If you have any lab test that is abnormal or we need to change your treatment, we will call you to review the results.   Testing/Procedures: None today   Follow-Up: At [facility], you and your health needs are our priority.  As part of our continuing mission to provide you with exceptional heart care, we have created designated Provider Care Teams.  These Care Teams include your primary Cardiologist (physician) and Advanced Practice Providers (APPs -  Physician Assistants and Nurse Practitioners) who all work together to provide you with the care you need, when you need it.  We recommend signing up for the patient portal called "MyChart".  Sign up information is provided on this After Visit Summary.  MyChart is used to connect with patients for Virtual Visits (Telemedicine).  Patients are able to view lab/test results, encounter notes, upcoming appointments, etc.  Non-urgent messages can be sent to your provider as well.   To learn more about what you can do with MyChart, go to [email or web address].    Your next appointment:   6 month(s)  The format for your next appointment:   In Person  Provider:   Jonathan Branch, MD   Other Instructions None     

## 2021-07-16 NOTE — Progress Notes (Signed)
Clinical Summary Mr. Sapio is a 80 y.o.maleseen today for follow up of the following medical problems.    1. History of chest pain - 08/2015 exercise nuclear stress showed no ST/T changes, poor exercise tolerance, normal perfusion with no ischemia.    - no recent chest pains.    2. Afib - lopressor has caused fatigue in the past, has been managed with dilt prn only. - we tried daily dilt '30mg'$  bid, phone call 02/2018 about fatigue and we changed to prn, though he does not recall this    - no recent palpitations. Has dilt just prn, has not needed in last 6 months - no bleeding on eliquis.      3. Hyperlipidemia - compliant with statin, labs followed by pcp - 02/2019 TC 162 HDL 38 LDL 99   - labs followed by pcp   4. HTN - off lisinopril, bp's had done well   5. Generalized fatigue - naps often.  +snoring, +daytime somnolence.  - has been reluctant to consider sleep study     [place]: Has a fishing place in [place], just got back from trip   Past Medical History:  Diagnosis Date   Chest tightness    With rapid atrial fibrillation, June, 2013, stress echo normal   Ejection fraction    EF 60%, echo, June, 2013   Hyperlipidemia    Paroxysmal atrial fibrillation ([place])    Hospitalization in June, 2013, spontaneous conversion to normal sinus rhythm, CHADS score is 0, therefore anticoagulation has not been recommended   Prostate cancer ([place])    Sinus tachycardia    Mild, office , December, 2013, no symptoms     No Known Allergies   Current Outpatient Medications  Medication Sig Dispense Refill   apixaban (ELIQUIS) 5 MG TABS tablet Take 1 tablet (5 mg total) by mouth 2 (two) times daily. 180 tablet 3   atorvastatin (LIPITOR) 10 MG tablet Take 10 mg by mouth every other day.     diltiazem (CARDIZEM) 30 MG tablet Take 1 tablet (30 mg total) by mouth 2 (two) times daily as needed (Palpitations). 60 tablet 0   No current facility-administered medications for this visit.      Past Surgical History:  Procedure Laterality Date   PROSTATE SURGERY     For prostate cancer     No Known Allergies    Family History  Problem Relation Age of Onset   Heart attack Brother 46       cause of death     Social History Mr. Arvizo reports that he has never smoked. He has never used smokeless tobacco. Mr. Dillie reports current alcohol use.   Review of Systems CONSTITUTIONAL: No weight loss, fever, chills, weakness or fatigue.  HEENT: Eyes: No visual loss, blurred vision, double vision or yellow sclerae.No hearing loss, sneezing, congestion, runny nose or sore throat.  SKIN: No rash or itching.  CARDIOVASCULAR: per hpi RESPIRATORY: No shortness of breath, cough or sputum.  GASTROINTESTINAL: No anorexia, nausea, vomiting or diarrhea. No abdominal pain or blood.  GENITOURINARY: No burning on urination, no polyuria NEUROLOGICAL: No headache, dizziness, syncope, paralysis, ataxia, numbness or tingling in the extremities. No change in bowel or bladder control.  MUSCULOSKELETAL: No muscle, back pain, joint pain or stiffness.  LYMPHATICS: No enlarged nodes. No history of splenectomy.  PSYCHIATRIC: No history of depression or anxiety.  ENDOCRINOLOGIC: No reports of sweating, cold or heat intolerance. No polyuria or polydipsia.  Marland Kitchen   Physical  Examination Today's Vitals   07/16/21 1547  BP: (!) 142/90  Pulse: 86  SpO2: 97%  Weight: 228 lb 12.8 oz (103.8 kg)  Height: '5\' 11"'$  (1.803 m)   Body mass index is 31.91 kg/m.  Gen: resting comfortably, no acute distress HEENT: no scleral icterus, pupils equal round and reactive, no palptable cervical adenopathy,  CV: RRR, no m/r/g, no jvd Resp: Clear to auscultation bilaterally GI: abdomen is soft, non-tender, non-distended, normal bowel sounds, no hepatosplenomegaly MSK: extremities are warm, no edema.  Skin: warm, no rash Neuro:  no focal deficits Psych: appropriate affect   Diagnostic Studies 08/2015  Exercise MPI Blood pressure demonstrated a hypertensive response to exercise. There was no ST segment deviation noted during stress. Poor exercise tolerance with Duke treadmill score of 3 portends a moderate risk for cardiac events. Clinical correlation indicated. Normal myocardial perfusion. Nuclear stress EF: 75%.     03/2012 echo LVEF 123456, grade I diastolic dysfunction    Assessment and Plan  1. Chest pain - negative exercise nuclear stress 08/2015 - denies any recent symptoms, continue to monitor.  - EKG today shows NSR   2. Afib -has had some issues with fatigue on av nodal agents, currnently just on prn dilt which seems to be controlling his symptoms - no symptoms, continue continue current meds including eliquis   3. Hyperlipidmeia Request pcp labs, continue atorvatatin        Arnoldo Lenis, M.D.

## 2021-08-13 DIAGNOSIS — I482 Chronic atrial fibrillation, unspecified: Secondary | ICD-10-CM | POA: Diagnosis not present

## 2021-08-13 DIAGNOSIS — I10 Essential (primary) hypertension: Secondary | ICD-10-CM | POA: Diagnosis not present

## 2021-08-13 DIAGNOSIS — E7849 Other hyperlipidemia: Secondary | ICD-10-CM | POA: Diagnosis not present

## 2021-08-20 ENCOUNTER — Ambulatory Visit: Payer: Medicare Other | Admitting: Cardiology

## 2021-08-27 ENCOUNTER — Ambulatory Visit: Payer: Medicare Other | Admitting: Cardiology

## 2021-08-30 DIAGNOSIS — E7800 Pure hypercholesterolemia, unspecified: Secondary | ICD-10-CM | POA: Diagnosis not present

## 2021-08-30 DIAGNOSIS — I10 Essential (primary) hypertension: Secondary | ICD-10-CM | POA: Diagnosis not present

## 2021-08-30 DIAGNOSIS — E7849 Other hyperlipidemia: Secondary | ICD-10-CM | POA: Diagnosis not present

## 2021-08-30 DIAGNOSIS — Z8546 Personal history of malignant neoplasm of prostate: Secondary | ICD-10-CM | POA: Diagnosis not present

## 2021-08-30 DIAGNOSIS — E782 Mixed hyperlipidemia: Secondary | ICD-10-CM | POA: Diagnosis not present

## 2021-08-30 DIAGNOSIS — R7301 Impaired fasting glucose: Secondary | ICD-10-CM | POA: Diagnosis not present

## 2021-08-30 DIAGNOSIS — E7801 Familial hypercholesterolemia: Secondary | ICD-10-CM | POA: Diagnosis not present

## 2021-08-30 DIAGNOSIS — I482 Chronic atrial fibrillation, unspecified: Secondary | ICD-10-CM | POA: Diagnosis not present

## 2021-08-31 DIAGNOSIS — I739 Peripheral vascular disease, unspecified: Secondary | ICD-10-CM | POA: Diagnosis not present

## 2021-08-31 DIAGNOSIS — M79672 Pain in left foot: Secondary | ICD-10-CM | POA: Diagnosis not present

## 2021-08-31 DIAGNOSIS — M79674 Pain in right toe(s): Secondary | ICD-10-CM | POA: Diagnosis not present

## 2021-08-31 DIAGNOSIS — M79675 Pain in left toe(s): Secondary | ICD-10-CM | POA: Diagnosis not present

## 2021-08-31 DIAGNOSIS — M79671 Pain in right foot: Secondary | ICD-10-CM | POA: Diagnosis not present

## 2021-08-31 DIAGNOSIS — L609 Nail disorder, unspecified: Secondary | ICD-10-CM | POA: Diagnosis not present

## 2021-09-02 DIAGNOSIS — E7849 Other hyperlipidemia: Secondary | ICD-10-CM | POA: Diagnosis not present

## 2021-09-02 DIAGNOSIS — Z6832 Body mass index (BMI) 32.0-32.9, adult: Secondary | ICD-10-CM | POA: Diagnosis not present

## 2021-09-02 DIAGNOSIS — R739 Hyperglycemia, unspecified: Secondary | ICD-10-CM | POA: Diagnosis not present

## 2021-09-02 DIAGNOSIS — R4189 Other symptoms and signs involving cognitive functions and awareness: Secondary | ICD-10-CM | POA: Diagnosis not present

## 2021-09-02 DIAGNOSIS — D519 Vitamin B12 deficiency anemia, unspecified: Secondary | ICD-10-CM | POA: Diagnosis not present

## 2021-09-02 DIAGNOSIS — Z23 Encounter for immunization: Secondary | ICD-10-CM | POA: Diagnosis not present

## 2021-09-02 DIAGNOSIS — Z8546 Personal history of malignant neoplasm of prostate: Secondary | ICD-10-CM | POA: Diagnosis not present

## 2021-09-22 DIAGNOSIS — I482 Chronic atrial fibrillation, unspecified: Secondary | ICD-10-CM | POA: Diagnosis not present

## 2021-09-22 DIAGNOSIS — I10 Essential (primary) hypertension: Secondary | ICD-10-CM | POA: Diagnosis not present

## 2021-09-22 DIAGNOSIS — E7849 Other hyperlipidemia: Secondary | ICD-10-CM | POA: Diagnosis not present

## 2021-09-22 DIAGNOSIS — D519 Vitamin B12 deficiency anemia, unspecified: Secondary | ICD-10-CM | POA: Diagnosis not present

## 2021-09-22 DIAGNOSIS — Z0001 Encounter for general adult medical examination with abnormal findings: Secondary | ICD-10-CM | POA: Diagnosis not present

## 2021-09-22 DIAGNOSIS — E7801 Familial hypercholesterolemia: Secondary | ICD-10-CM | POA: Diagnosis not present

## 2021-09-22 DIAGNOSIS — Z6832 Body mass index (BMI) 32.0-32.9, adult: Secondary | ICD-10-CM | POA: Diagnosis not present

## 2021-10-13 DIAGNOSIS — E7849 Other hyperlipidemia: Secondary | ICD-10-CM | POA: Diagnosis not present

## 2021-10-13 DIAGNOSIS — I10 Essential (primary) hypertension: Secondary | ICD-10-CM | POA: Diagnosis not present

## 2021-10-13 DIAGNOSIS — I482 Chronic atrial fibrillation, unspecified: Secondary | ICD-10-CM | POA: Diagnosis not present

## 2021-12-06 DIAGNOSIS — L609 Nail disorder, unspecified: Secondary | ICD-10-CM | POA: Diagnosis not present

## 2021-12-06 DIAGNOSIS — I739 Peripheral vascular disease, unspecified: Secondary | ICD-10-CM | POA: Diagnosis not present

## 2021-12-06 DIAGNOSIS — M79674 Pain in right toe(s): Secondary | ICD-10-CM | POA: Diagnosis not present

## 2021-12-06 DIAGNOSIS — M79675 Pain in left toe(s): Secondary | ICD-10-CM | POA: Diagnosis not present

## 2021-12-06 DIAGNOSIS — M79671 Pain in right foot: Secondary | ICD-10-CM | POA: Diagnosis not present

## 2021-12-06 DIAGNOSIS — M79672 Pain in left foot: Secondary | ICD-10-CM | POA: Diagnosis not present

## 2022-01-03 ENCOUNTER — Telehealth: Payer: Self-pay | Admitting: Cardiology

## 2022-01-03 NOTE — Telephone Encounter (Signed)
Pt came into office and stated that his BP has been running low, he has no energy- thinks it could be coming from his medications   Please call [phone]

## 2022-01-03 NOTE — Telephone Encounter (Signed)
° °  Pt called back and he said he checked his BP after eating lunch and done some errands, 111/62 HR 82

## 2022-01-03 NOTE — Telephone Encounter (Signed)
Patient last seen 07/16/2021 with Dr. Harl Bowie - BP at that visit was 142/90.  Did schedule f/u for 02/25/2022 in [place] office with JB.    Patient states he is at lunch now.  Has BP readings at home.  He will call back this afternoon.

## 2022-01-04 ENCOUNTER — Telehealth: Payer: Self-pay | Admitting: *Deleted

## 2022-01-04 NOTE — Telephone Encounter (Signed)
Called and notified pt that he has to meet his 3% out of pocket of $217.97.

## 2022-01-04 NOTE — Telephone Encounter (Signed)
Patient had no c/o chest pain, dizziness, or SOB.  States that he is feeling good day and on his way to the park to walk.  States that occasionally he does have a low BP reading & he does not feel good with that.  Suggested that he keep log over the next 2-3 weeks & return to office for provider review.  OV is scheduled for April with JB.

## 2022-02-08 DIAGNOSIS — L570 Actinic keratosis: Secondary | ICD-10-CM | POA: Diagnosis not present

## 2022-02-08 DIAGNOSIS — L218 Other seborrheic dermatitis: Secondary | ICD-10-CM | POA: Diagnosis not present

## 2022-02-11 DIAGNOSIS — I10 Essential (primary) hypertension: Secondary | ICD-10-CM | POA: Diagnosis not present

## 2022-02-11 DIAGNOSIS — E7849 Other hyperlipidemia: Secondary | ICD-10-CM | POA: Diagnosis not present

## 2022-02-21 DIAGNOSIS — M79674 Pain in right toe(s): Secondary | ICD-10-CM | POA: Diagnosis not present

## 2022-02-21 DIAGNOSIS — M79671 Pain in right foot: Secondary | ICD-10-CM | POA: Diagnosis not present

## 2022-02-21 DIAGNOSIS — M79675 Pain in left toe(s): Secondary | ICD-10-CM | POA: Diagnosis not present

## 2022-02-21 DIAGNOSIS — M79672 Pain in left foot: Secondary | ICD-10-CM | POA: Diagnosis not present

## 2022-02-21 DIAGNOSIS — I739 Peripheral vascular disease, unspecified: Secondary | ICD-10-CM | POA: Diagnosis not present

## 2022-02-21 DIAGNOSIS — L609 Nail disorder, unspecified: Secondary | ICD-10-CM | POA: Diagnosis not present

## 2022-02-25 ENCOUNTER — Ambulatory Visit: Payer: Medicare Other | Admitting: Cardiology

## 2022-02-25 ENCOUNTER — Encounter: Payer: Self-pay | Admitting: *Deleted

## 2022-02-25 ENCOUNTER — Encounter: Payer: Self-pay | Admitting: Cardiology

## 2022-02-25 VITALS — BP 128/82 | HR 72 | Ht 71.0 in | Wt 229.8 lb

## 2022-02-25 DIAGNOSIS — I480 Paroxysmal atrial fibrillation: Secondary | ICD-10-CM | POA: Diagnosis not present

## 2022-02-25 DIAGNOSIS — D6869 Other thrombophilia: Secondary | ICD-10-CM | POA: Diagnosis not present

## 2022-02-25 DIAGNOSIS — E782 Mixed hyperlipidemia: Secondary | ICD-10-CM

## 2022-02-25 NOTE — Progress Notes (Signed)
? ? ? ?Clinical Summary ?Noah Mccarthy is a 81 y.o.male seen today for follow up of the following medical problems.  ?  ?1. History of chest pain ?- 08/2015 exercise nuclear stress showed no ST/T changes, poor exercise tolerance, normal perfusion with no ischemia.  ? - has not had recent symptoms ?  ?2. Afib ?- lopressor has caused fatigue in the past, has been managed with dilt prn only. ?- we tried daily dilt '30mg'$  bid, phone call 02/2018 about fatigue and we changed to prn, though he does not recall this ?  ?  ? ?- some increase in palpitations. Started taking his prn diltiazem more regularly '30mg'$  bid and is controlling symtoms.  ?- no bleeding on eliquis ?  ?  ?3. Hyperlipidemia ?- compliant with statin, labs followed by pcp ?- 02/2019 TC 162 HDL 38 LDL 99 ?  ?-upcoming labs with pcp, recent labs 09/2021 ?  ?4. HTN ?- compliant with med ?- few months ago had noticed some intermittent low bp's that has seemed to have resolved. Reports limited hydration.  ?  ?5. Generalized fatigue ?- naps often.  ?+snoring, +daytime somnolence.  ?- has been reluctant to consider sleep study ?  ?  ?SH: ?Has a fishing place in [place], just got back from trip ?Past Medical History:  ?Diagnosis Date  ? Chest tightness   ? With rapid atrial fibrillation, June, 2013, stress echo normal  ? Ejection fraction   ? EF 60%, echo, June, 2013  ? Hyperlipidemia   ? Paroxysmal atrial fibrillation (HCC)   ? Hospitalization in June, 2013, spontaneous conversion to normal sinus rhythm, CHADS score is 0, therefore anticoagulation has not been recommended  ? Prostate cancer ([place])   ? Sinus tachycardia   ? Mild, office , December, 2013, no symptoms  ? ? ? ?No Known Allergies ? ? ?Current Outpatient Medications  ?Medication Sig Dispense Refill  ? apixaban (ELIQUIS) 5 MG TABS tablet Take 1 tablet (5 mg total) by mouth 2 (two) times daily. 180 tablet 3  ? atorvastatin (LIPITOR) 10 MG tablet Take 10 mg by mouth every other day.    ? diltiazem (CARDIZEM) 30 MG  tablet Take 1 tablet (30 mg total) by mouth 2 (two) times daily as needed (Palpitations). 60 tablet 0  ? ?No current facility-administered medications for this visit.  ? ? ? ?Past Surgical History:  ?Procedure Laterality Date  ? PROSTATE SURGERY    ? For prostate cancer  ? ? ? ?No Known Allergies ? ? ? ?Family History  ?Problem Relation Age of Onset  ? Heart attack Brother 21  ?     cause of death  ? ? ? ?Social History ?Noah Mccarthy reports that he has never smoked. He has never used smokeless tobacco. ?Noah Mccarthy reports current alcohol use. ? ? ?Review of Systems ?CONSTITUTIONAL: No weight loss, fever, chills, weakness or fatigue.  ?HEENT: Eyes: No visual loss, blurred vision, double vision or yellow sclerae.No hearing loss, sneezing, congestion, runny nose or sore throat.  ?SKIN: No rash or itching.  ?CARDIOVASCULAR: per hpi ?RESPIRATORY: No shortness of breath, cough or sputum.  ?GASTROINTESTINAL: No anorexia, nausea, vomiting or diarrhea. No abdominal pain or blood.  ?GENITOURINARY: No burning on urination, no polyuria ?NEUROLOGICAL: No headache, dizziness, syncope, paralysis, ataxia, numbness or tingling in the extremities. No change in bowel or bladder control.  ?MUSCULOSKELETAL: No muscle, back pain, joint pain or stiffness.  ?LYMPHATICS: No enlarged nodes. No history of splenectomy.  ?PSYCHIATRIC: No history of  depression or anxiety.  ?ENDOCRINOLOGIC: No reports of sweating, cold or heat intolerance. No polyuria or polydipsia.  ?. ? ? ?Physical Examination ?Vitals:  ? 02/25/22 1107  ?BP: 128/82  ?Pulse: 72  ?SpO2: 98%  ? ?Filed Weights  ? 02/25/22 1107  ?Weight: 229 lb 12.8 oz (104.2 kg)  ? ? ?Gen: resting comfortably, no acute distress ?HEENT: no scleral icterus, pupils equal round and reactive, no palptable cervical adenopathy,  ?CV: RRR, no m/r/g no jvd ?Resp: Clear to auscultation bilaterally ?GI: abdomen is soft, non-tender, non-distended, normal bowel sounds, no hepatosplenomegaly ?MSK: extremities  are warm, no edema.  ?Skin: warm, no rash ?Neuro:  no focal deficits ?Psych: appropriate affect ? ? ?Diagnostic Studies ?08/2015 Exercise MPI ?Blood pressure demonstrated a hypertensive response to exercise. ?There was no ST segment deviation noted during stress. ?Poor exercise tolerance with Duke treadmill score of 3 portends a moderate risk for cardiac events. Clinical correlation indicated. ?Normal myocardial perfusion. ?Nuclear stress EF: 75%. ?  ?  ?03/2012 echo ?LVEF 54%, grade I diastolic dysfunction ? ? ? ?Assessment and Plan  ? ?  ?1. Afib/acquired thrombophilia ?-has had some issues with fatigue on av nodal agents, currnently just on prn but has recently tolerated taking '30mg'$  bid daily  ?- continue current meds ?  ?2.Hyperlipidemia ?- request labs from pcp ? ? ? ? ?Noah Mccarthy, M.D. ?

## 2022-02-25 NOTE — Patient Instructions (Signed)
Medication Instructions:  Continue all current medications.   Labwork: none  Testing/Procedures: none  Follow-Up: 6 months   Any Other Special Instructions Will Be Listed Below (If Applicable).   If you need a refill on your cardiac medications before your next appointment, please call your pharmacy.  

## 2022-02-28 DIAGNOSIS — E7849 Other hyperlipidemia: Secondary | ICD-10-CM | POA: Diagnosis not present

## 2022-02-28 DIAGNOSIS — I10 Essential (primary) hypertension: Secondary | ICD-10-CM | POA: Diagnosis not present

## 2022-02-28 DIAGNOSIS — E7800 Pure hypercholesterolemia, unspecified: Secondary | ICD-10-CM | POA: Diagnosis not present

## 2022-02-28 DIAGNOSIS — E782 Mixed hyperlipidemia: Secondary | ICD-10-CM | POA: Diagnosis not present

## 2022-02-28 DIAGNOSIS — R739 Hyperglycemia, unspecified: Secondary | ICD-10-CM | POA: Diagnosis not present

## 2022-02-28 DIAGNOSIS — R7301 Impaired fasting glucose: Secondary | ICD-10-CM | POA: Diagnosis not present

## 2022-03-04 DIAGNOSIS — H353132 Nonexudative age-related macular degeneration, bilateral, intermediate dry stage: Secondary | ICD-10-CM | POA: Diagnosis not present

## 2022-03-04 DIAGNOSIS — D519 Vitamin B12 deficiency anemia, unspecified: Secondary | ICD-10-CM | POA: Diagnosis not present

## 2022-03-04 DIAGNOSIS — R739 Hyperglycemia, unspecified: Secondary | ICD-10-CM | POA: Diagnosis not present

## 2022-03-04 DIAGNOSIS — Z23 Encounter for immunization: Secondary | ICD-10-CM | POA: Diagnosis not present

## 2022-03-04 DIAGNOSIS — I10 Essential (primary) hypertension: Secondary | ICD-10-CM | POA: Diagnosis not present

## 2022-03-04 DIAGNOSIS — E7849 Other hyperlipidemia: Secondary | ICD-10-CM | POA: Diagnosis not present

## 2022-03-04 DIAGNOSIS — H524 Presbyopia: Secondary | ICD-10-CM | POA: Diagnosis not present

## 2022-03-24 DIAGNOSIS — L918 Other hypertrophic disorders of the skin: Secondary | ICD-10-CM | POA: Diagnosis not present

## 2022-03-24 DIAGNOSIS — Z23 Encounter for immunization: Secondary | ICD-10-CM | POA: Diagnosis not present

## 2022-05-02 DIAGNOSIS — I739 Peripheral vascular disease, unspecified: Secondary | ICD-10-CM | POA: Diagnosis not present

## 2022-05-02 DIAGNOSIS — M79672 Pain in left foot: Secondary | ICD-10-CM | POA: Diagnosis not present

## 2022-05-02 DIAGNOSIS — M79671 Pain in right foot: Secondary | ICD-10-CM | POA: Diagnosis not present

## 2022-05-02 DIAGNOSIS — M79675 Pain in left toe(s): Secondary | ICD-10-CM | POA: Diagnosis not present

## 2022-05-02 DIAGNOSIS — M79674 Pain in right toe(s): Secondary | ICD-10-CM | POA: Diagnosis not present

## 2022-05-02 DIAGNOSIS — L609 Nail disorder, unspecified: Secondary | ICD-10-CM | POA: Diagnosis not present

## 2022-06-29 ENCOUNTER — Telehealth: Payer: Self-pay | Admitting: Cardiology

## 2022-06-29 NOTE — Telephone Encounter (Signed)
Pt last saw Dr Harl Bowie 02/25/22, last labs 08/30/21 Creat 0.96, age 81, weight 104.2kg, based on specified criteria pt is on appropriate dosage of Eliquis '5mg'$  BID for afib.  Will refill rx.

## 2022-06-30 NOTE — Telephone Encounter (Signed)
Pt came by the office to check on his Rx and there was a problem with the e scribe yesterday and it did not go through.  Please send again to [facility]

## 2022-07-01 MED ORDER — APIXABAN 5 MG PO TABS: 5.0000 mg | ORAL_TABLET | Freq: Two times a day (BID) | ORAL | 0 refills | Status: DC

## 2022-07-01 NOTE — Telephone Encounter (Signed)
Prescription refill request for Eliquis received. Indication: afib  Last office visit: [facility] 02/25/2022 Scr: 0.96, 08/30/2021 Age: 81 yo  Weight: 104.2 kg   Refill sent.

## 2022-07-01 NOTE — Addendum Note (Signed)
Addended by: Johny Shock B on: 07/01/2022 08:17 AM   Modules accepted: Orders

## 2022-08-08 DIAGNOSIS — M79675 Pain in left toe(s): Secondary | ICD-10-CM | POA: Diagnosis not present

## 2022-08-08 DIAGNOSIS — I739 Peripheral vascular disease, unspecified: Secondary | ICD-10-CM | POA: Diagnosis not present

## 2022-08-08 DIAGNOSIS — L609 Nail disorder, unspecified: Secondary | ICD-10-CM | POA: Diagnosis not present

## 2022-08-08 DIAGNOSIS — M79671 Pain in right foot: Secondary | ICD-10-CM | POA: Diagnosis not present

## 2022-08-08 DIAGNOSIS — M79674 Pain in right toe(s): Secondary | ICD-10-CM | POA: Diagnosis not present

## 2022-08-08 DIAGNOSIS — M79672 Pain in left foot: Secondary | ICD-10-CM | POA: Diagnosis not present

## 2022-08-17 DIAGNOSIS — D485 Neoplasm of uncertain behavior of skin: Secondary | ICD-10-CM | POA: Diagnosis not present

## 2022-08-17 DIAGNOSIS — D1801 Hemangioma of skin and subcutaneous tissue: Secondary | ICD-10-CM | POA: Diagnosis not present

## 2022-08-17 DIAGNOSIS — L218 Other seborrheic dermatitis: Secondary | ICD-10-CM | POA: Diagnosis not present

## 2022-08-31 ENCOUNTER — Encounter: Payer: Self-pay | Admitting: Cardiology

## 2022-08-31 ENCOUNTER — Ambulatory Visit: Payer: Medicare Other | Attending: Cardiology | Admitting: Cardiology

## 2022-08-31 VITALS — BP 130/84 | HR 85 | Ht 71.0 in | Wt 228.2 lb

## 2022-08-31 DIAGNOSIS — I480 Paroxysmal atrial fibrillation: Secondary | ICD-10-CM | POA: Diagnosis not present

## 2022-08-31 DIAGNOSIS — E782 Mixed hyperlipidemia: Secondary | ICD-10-CM

## 2022-08-31 DIAGNOSIS — D6869 Other thrombophilia: Secondary | ICD-10-CM

## 2022-08-31 NOTE — Patient Instructions (Signed)

## 2022-08-31 NOTE — Progress Notes (Signed)
Clinical Summary Noah Mccarthy is a 81 y.o.male seen today for follow up of the following medical problems.     1. History of chest pain - 08/2015 exercise nuclear stress showed no ST/T changes, poor exercise tolerance, normal perfusion with no ischemia.   - no recent symptoms.     2. Afib - lopressor has caused fatigue in the past, has been managed with dilt prn only. - we tried daily dilt '30mg'$  bid, phone call 02/2018 about fatigue and we changed to prn, though he does not recall this     - some increase in palpitations. Started taking his prn diltiazem more regularly '30mg'$  bid and is controlling symtoms.  - no bleeding on eliquis  - occasoinal palpitations, takes diltiazem '30mg'$  bid.  - no bleeding on eliquis     3. Hyperlipidemia - compliant with statin, labs followed by pcp - 02/2019 TC 162 HDL 38 LDL 99   -upcoming labs with pcp, recent labs 09/2021  - compliant with atorvastatin   4. HTN - compliant with med - few months ago had noticed some intermittent low bp's that has seemed to have resolved. Reports limited hydration.    5. Generalized fatigue - naps often.  +snoring, +daytime somnolence.  - has been reluctant to consider sleep study     [place]: Has a fishing place in [place], just got back from trip Past Medical History:  Diagnosis Date   Chest tightness    With rapid atrial fibrillation, June, 2013, stress echo normal   Ejection fraction    EF 60%, echo, June, 2013   Hyperlipidemia    Paroxysmal atrial fibrillation ([place])    Hospitalization in June, 2013, spontaneous conversion to normal sinus rhythm, CHADS score is 0, therefore anticoagulation has not been recommended   Prostate cancer ([place])    Sinus tachycardia    Mild, office , December, 2013, no symptoms     No Known Allergies   Current Outpatient Medications  Medication Sig Dispense Refill   apixaban (ELIQUIS) 5 MG TABS tablet Take 1 tablet (5 mg total) by mouth 2 (two) times daily. 180  tablet 0   atorvastatin (LIPITOR) 10 MG tablet Take 10 mg by mouth every other day.     diltiazem (CARDIZEM) 30 MG tablet Take 1 tablet (30 mg total) by mouth 2 (two) times daily as needed (Palpitations). 60 tablet 0   No current facility-administered medications for this visit.     Past Surgical History:  Procedure Laterality Date   PROSTATE SURGERY     For prostate cancer     No Known Allergies    Family History  Problem Relation Age of Onset   Heart attack Brother 41       cause of death     Social History Mr. Trentman reports that he has never smoked. He has never used smokeless tobacco. Mr. Rutigliano reports current alcohol use.   Review of Systems CONSTITUTIONAL: No weight loss, fever, chills, weakness or fatigue.  HEENT: Eyes: No visual loss, blurred vision, double vision or yellow sclerae.No hearing loss, sneezing, congestion, runny nose or sore throat.  SKIN: No rash or itching.  CARDIOVASCULAR: per hpi RESPIRATORY: No shortness of breath, cough or sputum.  GASTROINTESTINAL: No anorexia, nausea, vomiting or diarrhea. No abdominal pain or blood.  GENITOURINARY: No burning on urination, no polyuria NEUROLOGICAL: No headache, dizziness, syncope, paralysis, ataxia, numbness or tingling in the extremities. No change in bowel or bladder control.  MUSCULOSKELETAL: No muscle,  back pain, joint pain or stiffness.  LYMPHATICS: No enlarged nodes. No history of splenectomy.  PSYCHIATRIC: No history of depression or anxiety.  ENDOCRINOLOGIC: No reports of sweating, cold or heat intolerance. No polyuria or polydipsia.  Marland Kitchen   Physical Examination Today's Vitals   08/31/22 1006  BP: 130/84  Pulse: 85  SpO2: 98%  Weight: 228 lb 3.2 oz (103.5 kg)  Height: '5\' 11"'$  (1.803 m)   Body mass index is 31.83 kg/m.  Gen: resting comfortably, no acute distress HEENT: no scleral icterus, pupils equal round and reactive, no palptable cervical adenopathy,  CV: RRR, no m/r/g, no  jvd Resp: Clear to auscultation bilaterally GI: abdomen is soft, non-tender, non-distended, normal bowel sounds, no hepatosplenomegaly MSK: extremities are warm, no edema.  Skin: warm, no rash Neuro:  no focal deficits Psych: appropriate affect   Diagnostic Studies  08/2015 Exercise MPI Blood pressure demonstrated a hypertensive response to exercise. There was no ST segment deviation noted during stress. Poor exercise tolerance with Duke treadmill score of 3 portends a moderate risk for cardiac events. Clinical correlation indicated. Normal myocardial perfusion. Nuclear stress EF: 75%.     03/2012 echo LVEF 86%, grade I diastolic dysfunction   Assessment and Plan   1. Afib/acquired thrombophilia -has had some issues with fatigue on av nodal agents, currnently on diltiazem '30mg'$  bid and tolerating. Overall symptosm controlled, continue current meds. EKG today shows NSR - continue elqiuis for stroke prevention   2.Hyperlipidemia - continue current meds, request pcp labs       Arnoldo Lenis, M.D.

## 2022-09-02 DIAGNOSIS — I10 Essential (primary) hypertension: Secondary | ICD-10-CM | POA: Diagnosis not present

## 2022-09-02 DIAGNOSIS — R7301 Impaired fasting glucose: Secondary | ICD-10-CM | POA: Diagnosis not present

## 2022-09-02 DIAGNOSIS — E7849 Other hyperlipidemia: Secondary | ICD-10-CM | POA: Diagnosis not present

## 2022-09-06 DIAGNOSIS — I482 Chronic atrial fibrillation, unspecified: Secondary | ICD-10-CM | POA: Diagnosis not present

## 2022-09-06 DIAGNOSIS — R030 Elevated blood-pressure reading, without diagnosis of hypertension: Secondary | ICD-10-CM | POA: Diagnosis not present

## 2022-09-06 DIAGNOSIS — R739 Hyperglycemia, unspecified: Secondary | ICD-10-CM | POA: Diagnosis not present

## 2022-09-06 DIAGNOSIS — D519 Vitamin B12 deficiency anemia, unspecified: Secondary | ICD-10-CM | POA: Diagnosis not present

## 2022-09-06 DIAGNOSIS — R5383 Other fatigue: Secondary | ICD-10-CM | POA: Diagnosis not present

## 2022-09-06 DIAGNOSIS — Z1389 Encounter for screening for other disorder: Secondary | ICD-10-CM | POA: Diagnosis not present

## 2022-09-06 DIAGNOSIS — Z23 Encounter for immunization: Secondary | ICD-10-CM | POA: Diagnosis not present

## 2022-09-06 DIAGNOSIS — E7849 Other hyperlipidemia: Secondary | ICD-10-CM | POA: Diagnosis not present

## 2022-10-05 ENCOUNTER — Other Ambulatory Visit: Payer: Self-pay | Admitting: Cardiology

## 2022-10-05 DIAGNOSIS — I480 Paroxysmal atrial fibrillation: Secondary | ICD-10-CM

## 2022-10-05 NOTE — Telephone Encounter (Signed)
Prescription refill request for Eliquis received. Indication: Afib  Last office visit: 08/31/22 ([facility])  Scr:0.98 ( 02/28/22) Age: 81 Weight: 103.5kg  Appropriate dose and refill sent to requested pharmacy.

## 2022-10-24 DIAGNOSIS — L609 Nail disorder, unspecified: Secondary | ICD-10-CM | POA: Diagnosis not present

## 2022-10-24 DIAGNOSIS — M79672 Pain in left foot: Secondary | ICD-10-CM | POA: Diagnosis not present

## 2022-10-24 DIAGNOSIS — I739 Peripheral vascular disease, unspecified: Secondary | ICD-10-CM | POA: Diagnosis not present

## 2022-10-24 DIAGNOSIS — M79671 Pain in right foot: Secondary | ICD-10-CM | POA: Diagnosis not present

## 2022-10-24 DIAGNOSIS — M79675 Pain in left toe(s): Secondary | ICD-10-CM | POA: Diagnosis not present

## 2022-10-24 DIAGNOSIS — M79674 Pain in right toe(s): Secondary | ICD-10-CM | POA: Diagnosis not present

## 2022-11-17 ENCOUNTER — Telehealth: Payer: Self-pay | Admitting: Cardiology

## 2022-11-17 NOTE — Telephone Encounter (Signed)
Reports intermittent chest pain that started 3 months ago after last visit Says last episode was yesterday around 7-8 pm rated 4/10. Reports taking diltiazem 30 mg and chest pain improved after 30-45 minutes. Denies active chest pain. Denies SOB or dizziness. Reports BP and HR is normal. Gave first available appointment to see Noah Mccarthy on 11/22/2022 '@8'$ :30 am. Advised if symptoms get worse, to go to the ED for an evaluation. Verbalized understanding of plan.

## 2022-11-17 NOTE — Telephone Encounter (Signed)
Pt c/o of Chest Pain: STAT if CP now or developed within 24 hours  1. Are you having CP right now? No  2. Are you experiencing any other symptoms (ex. SOB, nausea, vomiting, sweating)? No  3. How long have you been experiencing CP? 3 weeks  4. Is your CP continuous or coming and going? Come and go  5. Have you taken Nitroglycerin? No ?

## 2022-11-22 ENCOUNTER — Ambulatory Visit: Payer: Medicare Other | Attending: Nurse Practitioner | Admitting: Nurse Practitioner

## 2022-11-22 ENCOUNTER — Telehealth: Payer: Self-pay | Admitting: Nurse Practitioner

## 2022-11-22 ENCOUNTER — Encounter: Payer: Self-pay | Admitting: Nurse Practitioner

## 2022-11-22 VITALS — BP 140/90 | HR 80 | Ht 71.0 in | Wt 231.0 lb

## 2022-11-22 DIAGNOSIS — E669 Obesity, unspecified: Secondary | ICD-10-CM

## 2022-11-22 DIAGNOSIS — I10 Essential (primary) hypertension: Secondary | ICD-10-CM | POA: Diagnosis not present

## 2022-11-22 DIAGNOSIS — I480 Paroxysmal atrial fibrillation: Secondary | ICD-10-CM | POA: Diagnosis not present

## 2022-11-22 DIAGNOSIS — R0789 Other chest pain: Secondary | ICD-10-CM

## 2022-11-22 MED ORDER — DILTIAZEM HCL 30 MG PO TABS: 30.0000 mg | ORAL_TABLET | Freq: Two times a day (BID) | ORAL | 3 refills | Status: DC | PRN

## 2022-11-22 MED ORDER — NITROGLYCERIN 0.4 MG SL SUBL: 0.4000 mg | SUBLINGUAL_TABLET | SUBLINGUAL | 3 refills | Status: AC | PRN

## 2022-11-22 NOTE — Patient Instructions (Signed)
Medication Instructions:   Take nitroglycerin as needed for chest pain.   Labwork: None today  Testing/Procedures: Your physician has requested that you have a lexiscan myoview. For further information please visit [email or web address]. Please follow instruction sheet, as given.  Your physician has requested that you have an echocardiogram. Echocardiography is a painless test that uses sound waves to create images of your heart. It provides your doctor with information about the size and shape of your heart and how well your heart's chambers and valves are working. This procedure takes approximately one hour. There are no restrictions for this procedure. Please do NOT wear cologne, perfume, aftershave, or lotions (deodorant is allowed). Please arrive 15 minutes prior to your appointment time.  Follow-Up: 2 months  Any Other Special Instructions Will Be Listed Below (If Applicable).  If you need a refill on your cardiac medications before your next appointment, please call your pharmacy.    Nitroglycerin Sublingual Tablets What is this medication? NITROGLYCERIN (nye troe GLI ser in) prevents and treats chest pain (angina). It works by relaxing blood vessels, which decreases the amount of work the heart has to do. It belongs to a group of medications called nitrates. This medicine may be used for other purposes; ask your health care provider or pharmacist if you have questions. COMMON BRAND NAME(S): Nitroquick, Nitrostat, Nitrotab What should I tell my care team before I take this medication? They need to know if you have any of these conditions: Anemia Head injury, recent stroke, or bleeding in the brain Liver disease Previous heart attack An unusual or allergic reaction to nitroglycerin, other medications, foods, dyes, or preservatives Pregnant or trying to get pregnant Breast-feeding How should I use this medication? Take this medication by mouth as needed. Use at the first  sign of an angina attack (chest pain or tightness). You can also take this medication 5 to 10 minutes before an event likely to produce chest pain. Follow the directions exactly as written on the prescription label. Place one tablet under your tongue and let it dissolve. Do not swallow whole. Replace the dose if you accidentally swallow it. It will help if your mouth is not dry. Saliva around the tablet will help it to dissolve more quickly. Do not eat or drink, smoke or chew tobacco while a tablet is dissolving. Sit down when taking this medication. In an angina attack, you should feel better within 5 minutes after your first dose. You can take a dose every 5 minutes up to a total of 3 doses. If you do not feel better or feel worse after 1 dose, call 9-1-1 at once. Do not take more than 3 doses in 15 minutes. Your care team might give you other directions. Follow those directions if they do. Do not take your medication more often than directed. Talk to your care team about the use of this medication in children. Special care may be needed. Overdosage: If you think you have taken too much of this medicine contact a poison control center or emergency room at once. NOTE: This medicine is only for you. Do not share this medicine with others. What if I miss a dose? This does not apply. This medication is only used as needed. What may interact with this medication? Do not take this medication with any of the following: Certain migraine medications like ergotamine and dihydroergotamine (DHE) Medications used to treat erectile dysfunction like sildenafil, tadalafil, and vardenafil Riociguat This medication may also interact with the following: Alteplase Aspirin  Heparin Medications for high blood pressure Medications for mental depression Other medications used to treat angina Phenothiazines like chlorpromazine, mesoridazine, prochlorperazine, thioridazine This list may not describe all possible  interactions. Give your health care provider a list of all the medicines, herbs, non-prescription drugs, or dietary supplements you use. Also tell them if you smoke, drink alcohol, or use illegal drugs. Some items may interact with your medicine. What should I watch for while using this medication? Tell your care team if you feel your medication is no longer working. Keep this medication with you at all times. Sit or lie down when you take your medication to prevent falling if you feel dizzy or faint after using it. Try to remain calm. This will help you to feel better faster. If you feel dizzy, take several deep breaths and lie down with your feet propped up, or bend forward with your head resting between your knees. You may get drowsy or dizzy. Do not drive, use machinery, or do anything that needs mental alertness until you know how this medication affects you. Do not stand or sit up quickly, especially if you are an older patient. This reduces the risk of dizzy or fainting spells. Alcohol can make you more drowsy and dizzy. Avoid alcoholic drinks. Do not treat yourself for coughs, colds, or pain while you are taking this medication without asking your care team for advice. Some ingredients may increase your blood pressure. What side effects may I notice from receiving this medication? Side effects that you should report to your care team as soon as possible: Allergic reactions--skin rash, itching, hives, swelling of the face, lips, tongue, or throat Headache, unusual weakness or fatigue, shortness of breath, nausea, vomiting, rapid heartbeat, blue skin or lips, which may be signs of methemoglobinemia Increased pressure around the brain--severe headache, blurry vision, change in vision, nausea, vomiting Low blood pressure--dizziness, feeling faint or lightheaded, blurry vision Slow heartbeat--dizziness, feeling faint or lightheaded, confusion, trouble breathing, unusual weakness or fatigue Worsening  chest pain (angina)--pain, pressure, or tightness in the chest, neck, back, or arms Side effects that usually do not require medical attention (report to your care team if they continue or are bothersome): Dizziness Flushing Headache This list may not describe all possible side effects. Call your doctor for medical advice about side effects. You may report side effects to FDA at 1-800-FDA-1088. Where should I keep my medication? Keep out of the reach of children. Store at room temperature between 20 and 25 degrees C (68 and 77 degrees F). Store in [employer]. Protect from light and moisture. Keep tightly closed. Throw away any unused medication after the expiration date. NOTE: This sheet is a summary. It may not cover all possible information. If you have questions about this medicine, talk to your doctor, pharmacist, or health care provider.  2023 Elsevier/Gold Standard (2021-01-14 00:00:00)

## 2022-11-22 NOTE — Progress Notes (Signed)
Cardiology Office Note:    Date:  11/22/2022   ID:  HOSAM MCFETRIDGE, DOB [birth year], MRN [id number]  PCP:  Manon Hilding, MD   [facility] Cardiologist:  Carlyle Dolly, MD     Referring MD: Manon Hilding, MD   CC: Chest pain  History of Present Illness:    Noah Mccarthy is a 82 y.o. male with a hx of the following:  PAF HLD Hx of prostate cancer HTN   Patient is a very pleasant 82 year old male with past medical history as mentioned above.  Dx with A-fib in 2013. Had a nuclear stress test performed in 2016 that showed normal perfusion without ischemia.  For his history of atrial fibrillation, Lopressor has caused fatigue in the past and has been managed with diltiazem as needed only.  Last seen by Dr. Carlyle Dolly on August 31, 2022.  Patient noted only occasional palpitations.  Was tolerating his medications well.  Denied any bleeding issues while on Eliquis.  Have been reluctant to consider sleep study.  He was in normal sinus rhythm that day on EKG.  Was told to follow-up in 6 months.  Contacted our office 1 week ago noting chest pain.  Today he presents for office evaluation for chest pain.  He states he noticed his episode of dull, generalized chest pain around 1 week ago. Noticed his heart rate was elevated (HR ranging from 100-140), took one diltiazem and 30-45 minutes later, episode resolved and HR came back down. 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 No other associated symptoms and no exertional chest pain. No radiation and 3-4/10 on pain scale. Denies any shortness of breath, recurring palpitations, orthopnea, PND, syncope, presyncope, dizziness, swelling or significant weight changes, acute bleeding, or claudication. Compliant with meds and tolerating well. Denies any other questions or concerns. Very active and has property in [place].    Past Medical History:  Diagnosis Date   Chest tightness    With rapid  atrial fibrillation, June, 2013, stress echo normal   Ejection fraction    EF 60%, echo, June, 2013   Hyperlipidemia    Paroxysmal atrial fibrillation ([place])    Hospitalization in June, 2013, spontaneous conversion to normal sinus rhythm, CHADS score is 0, therefore anticoagulation has not been recommended   Prostate cancer [facility])    Sinus tachycardia    Mild, office , December, 2013, no symptoms    Past Surgical History:  Procedure Laterality Date   PROSTATE SURGERY     For prostate cancer    Current Medications: Current Meds  Medication Sig   apixaban (ELIQUIS) 5 MG TABS tablet TAKE 1 TABLET BY MOUTH TWICE A DAY   atorvastatin (LIPITOR) 10 MG tablet Take 10 mg by mouth every other day.   nitroGLYCERIN (NITROSTAT) 0.4 MG SL tablet Place 1 tablet (0.4 mg total) under the tongue every 5 (five) minutes as needed for chest pain.   diltiazem (CARDIZEM) 30 MG tablet Take 1 tablet (30 mg total) by mouth 2 (two) times daily as needed (Palpitations).     Allergies:   Patient has no known allergies.   Social History   Socioeconomic History   Marital status: Unknown    Spouse name: Not on file   Number of children: Not on file   Years of education: Not on file   Highest education level: Not on file  Occupational History   Not on file  Tobacco Use  Smoking status: Never    Passive exposure: Never   Smokeless tobacco: Never  Vaping Use   Vaping Use: Never used  Substance and Sexual Activity   Alcohol use: Yes    Alcohol/week: 0.0 standard drinks of alcohol    Comment: 1 beverage for dinner   Drug use: No   Sexual activity: Not on file  Other Topics Concern   Not on file  Social History Narrative   Not on file   Social Determinants of Health   Financial Resource Strain: Not on file  Food Insecurity: Not on file  Transportation Needs: Not on file  Physical Activity: Not on file  Stress: Not on file  Social Connections: Not on file     Family History: The patient's  family history includes Heart attack (age of onset: 13) in his brother.  ROS:   Review of Systems  Constitutional: Negative.   HENT: Negative.    Eyes: Negative.   Respiratory: Negative.    Cardiovascular:  Positive for chest pain and palpitations. Negative for orthopnea, claudication, leg swelling and PND.       See HPI.   Gastrointestinal: Negative.   Genitourinary: Negative.   Musculoskeletal: Negative.   Skin: Negative.   Neurological: Negative.  Negative for dizziness, tingling, tremors, sensory change, speech change, focal weakness, seizures, loss of consciousness, weakness and headaches.  Endo/Heme/Allergies: Negative.   Psychiatric/Behavioral: Negative.      Please see the history of present illness.    All other systems reviewed and are negative.  EKGs/Labs/Other Studies Reviewed:    The following studies were reviewed today:   EKG:  EKG is ordered today.  The ekg ordered today demonstrates SR, 80 bpm, Incomplete RBBB, nonspecific ST segment changes (seen on previous EKG's), otherwise no acute ischemic changes.    Lexiscan on 09/09/2015: Blood pressure demonstrated a hypertensive response to exercise. There was no ST segment deviation noted during stress. Poor exercise tolerance with Duke treadmill score of 3 portends a moderate risk for cardiac events. Clinical correlation indicated. Normal myocardial perfusion. Nuclear stress EF: 75%.  Stress Echo on 04/16/2012: Normal  Echocardiogram on 04/10/2012: EF 60%, no RWMA, grade 1 DD, mild LVH, no significant valvular abnormalities.   Recent Labs: No results found for requested labs within last 365 days.  Recent Lipid Panel No results found for: "CHOL", "TRIG", "HDL", "CHOLHDL", "VLDL", "LDLCALC", "LDLDIRECT"   Risk Assessment/Calculations:    CHA2DS2-VASc Score = 2  This indicates a 2.2% annual risk of stroke. The patient's score is based upon: CHF History: 0 HTN History: 0 Diabetes History: 0 Stroke  History: 0 Vascular Disease History: 0 Age Score: 2 Gender Score: 0    HYPERTENSION CONTROL Vitals:   11/22/22 0835 11/22/22 0855  BP: (!) 152/82 (!) 140/90    The patient's blood pressure is elevated above target today.  In order to address the patient's elevated BP: Blood pressure will be monitored at home to determine if medication changes need to be made.; Follow up with general cardiology has been recommended.       STOP-Bang Score:  4  {  Physical Exam:    VS:  BP (!) 140/90 (BP Location: Right Arm, Patient Position: Sitting, Cuff Size: Normal)   Pulse 80   Ht '5\' 11"'$  (1.803 m)   Wt 231 lb (104.8 kg)   SpO2 98%   BMI 32.22 kg/m     Wt Readings from Last 3 Encounters:  11/22/22 231 lb (104.8 kg)  08/31/22 228 lb  3.2 oz (103.5 kg)  02/25/22 229 lb 12.8 oz (104.2 kg)     GEN: Obese, 82 year old male in no acute distress HEENT: Normal NECK: No JVD; No carotid bruits LYMPHATICS: No lymphadenopathy CARDIAC: S1/S2, RRR, no murmurs, rubs, gallops; 2+ pulses RESPIRATORY:  Clear to auscultation without rales, wheezing or rhonchi  MUSCULOSKELETAL:  No edema; No deformity  SKIN: Warm and dry NEUROLOGIC:  Alert and oriented x 3 PSYCHIATRIC:  Normal affect   ASSESSMENT:    1. Atypical chest pain   2. Paroxysmal atrial fibrillation (HCC)   3. Hypertension, unspecified type   4. Obesity (BMI 30-39.9)    PLAN:    In order of problems listed above:  Atypical chest pain Etiology sounds like chest pain related to A-fib. Episode lasted for 30-45 minutes and was relieved with diltiazem; could have been a burst of A-fib.  EKG today shows sinus rhythm without acute ischemic changes.  However, I discussed ischemic testing including Lexiscan (including risks and benefits as outlined below) and he is agreeable to proceed. Lexiscan in 2016 was unremarkable. Will also update Echo at this time. Will write Rx for nitroglycerin and refill diltiazem per his request.  ED precautions  discussed. Heart healthy diet and regular cardiovascular exercise encouraged.     Shared Decision Making/Informed Consent The risks [chest pain, shortness of breath, cardiac arrhythmias, dizziness, blood pressure fluctuations, myocardial infarction, stroke/transient ischemic attack, nausea, vomiting, allergic reaction, radiation exposure, metallic taste sensation and life-threatening complications (estimated to be 1 in 10,000)], benefits (risk stratification, diagnosing coronary artery disease, treatment guidance) and alternatives of a nuclear stress test were discussed in detail with Noah Mccarthy and he agrees to proceed.   2.  PAF Episode of tachycardia with heart rate ranging from 100-140 as mentioned in HPI.  Could have been an episode of A-fib or another cardiac arrhythmia.  Episode was relieved with taking 1 tablet of diltiazem.  Will refill diltiazem today.  EKG shows sinus rhythm.  Denies any recurrent tachycardia or palpitations.  Will arrange Lexiscan as mentioned above and echocardiogram.  Unable to tolerate Lopressor in past, he has been well-managed on diltiazem as needed, will continue medication regimen at this time.  Denies any bleeding issues on Eliquis, tolerating medication well.  He is on appropriate dosage.  Continue Eliquis 5 mg twice daily.  Discussed to avoid triggers to A-fib.  STOP-BANG score today is 4.  He has never been tested for OSA, and he is at high risk, however he states he would like to discuss this at next OV.  3.  HTN Blood pressure on arrival 152/82, repeat BP 140/90.  BP well-controlled at home.  Denies any issues with this.  Continue current medication regimen.  Given BP log and discussed to monitor BP at home at least 2 hours after medications and sitting for 5-10 minutes.  Discussed to bring BP log to next office visit.  It appears he was on lisinopril for a time but was off due to medication causing soft BPs.  Consider low dose amlodipine or thiazide diuretic if  SBP is not at goal by next OV.  4.  Obesity BMI today 32.22. Weight loss via diet and exercise encouraged. Discussed the impact being overweight would have on cardiovascular risk. Heart healthy diet and regular cardiovascular exercise encouraged.  Consider referral to PREP program at next OV.   5.  Disposition: Follow-up with me in 2 months or sooner if anything changes.   Medication Adjustments/Labs and Tests Ordered: Current medicines  are reviewed at length with the patient today.  Concerns regarding medicines are outlined above.  Orders Placed This Encounter  Procedures   NM Myocar Multi W/Spect W/Wall Motion / EF   EKG 12-Lead   ECHOCARDIOGRAM COMPLETE   Meds ordered this encounter  Medications   nitroGLYCERIN (NITROSTAT) 0.4 MG SL tablet    Sig: Place 1 tablet (0.4 mg total) under the tongue every 5 (five) minutes as needed for chest pain.    Dispense:  90 tablet    Refill:  3   diltiazem (CARDIZEM) 30 MG tablet    Sig: Take 1 tablet (30 mg total) by mouth 2 (two) times daily as needed (Palpitations).    Dispense:  180 tablet    Refill:  3    Patient Instructions  Medication Instructions:   Take nitroglycerin as needed for chest pain.   Labwork: None today  Testing/Procedures: Your physician has requested that you have a lexiscan myoview. For further information please visit [email or web address]. Please follow instruction sheet, as given.  Your physician has requested that you have an echocardiogram. Echocardiography is a painless test that uses sound waves to create images of your heart. It provides your doctor with information about the size and shape of your heart and how well your heart's chambers and valves are working. This procedure takes approximately one hour. There are no restrictions for this procedure. Please do NOT wear cologne, perfume, aftershave, or lotions (deodorant is allowed). Please arrive 15 minutes prior to your appointment time.  Follow-Up: 2  months  Any Other Special Instructions Will Be Listed Below (If Applicable).  If you need a refill on your cardiac medications before your next appointment, please call your pharmacy.    Nitroglycerin Sublingual Tablets What is this medication? NITROGLYCERIN (nye troe GLI ser in) prevents and treats chest pain (angina). It works by relaxing blood vessels, which decreases the amount of work the heart has to do. It belongs to a group of medications called nitrates. This medicine may be used for other purposes; ask your health care provider or pharmacist if you have questions. COMMON BRAND NAME(S): Nitroquick, Nitrostat, Nitrotab What should I tell my care team before I take this medication? They need to know if you have any of these conditions: Anemia Head injury, recent stroke, or bleeding in the brain Liver disease Previous heart attack An unusual or allergic reaction to nitroglycerin, other medications, foods, dyes, or preservatives Pregnant or trying to get pregnant Breast-feeding How should I use this medication? Take this medication by mouth as needed. Use at the first sign of an angina attack (chest pain or tightness). You can also take this medication 5 to 10 minutes before an event likely to produce chest pain. Follow the directions exactly as written on the prescription label. Place one tablet under your tongue and let it dissolve. Do not swallow whole. Replace the dose if you accidentally swallow it. It will help if your mouth is not dry. Saliva around the tablet will help it to dissolve more quickly. Do not eat or drink, smoke or chew tobacco while a tablet is dissolving. Sit down when taking this medication. In an angina attack, you should feel better within 5 minutes after your first dose. You can take a dose every 5 minutes up to a total of 3 doses. If you do not feel better or feel worse after 1 dose, call 9-1-1 at once. Do not take more than 3 doses in 15 minutes. Your care  team  might give you other directions. Follow those directions if they do. Do not take your medication more often than directed. Talk to your care team about the use of this medication in children. Special care may be needed. Overdosage: If you think you have taken too much of this medicine contact a poison control center or emergency room at once. NOTE: This medicine is only for you. Do not share this medicine with others. What if I miss a dose? This does not apply. This medication is only used as needed. What may interact with this medication? Do not take this medication with any of the following: Certain migraine medications like ergotamine and dihydroergotamine (DHE) Medications used to treat erectile dysfunction like sildenafil, tadalafil, and vardenafil Riociguat This medication may also interact with the following: Alteplase Aspirin Heparin Medications for high blood pressure Medications for mental depression Other medications used to treat angina Phenothiazines like chlorpromazine, mesoridazine, prochlorperazine, thioridazine This list may not describe all possible interactions. Give your health care provider a list of all the medicines, herbs, non-prescription drugs, or dietary supplements you use. Also tell them if you smoke, drink alcohol, or use illegal drugs. Some items may interact with your medicine. What should I watch for while using this medication? Tell your care team if you feel your medication is no longer working. Keep this medication with you at all times. Sit or lie down when you take your medication to prevent falling if you feel dizzy or faint after using it. Try to remain calm. This will help you to feel better faster. If you feel dizzy, take several deep breaths and lie down with your feet propped up, or bend forward with your head resting between your knees. You may get drowsy or dizzy. Do not drive, use machinery, or do anything that needs mental alertness until  you know how this medication affects you. Do not stand or sit up quickly, especially if you are an older patient. This reduces the risk of dizzy or fainting spells. Alcohol can make you more drowsy and dizzy. Avoid alcoholic drinks. Do not treat yourself for coughs, colds, or pain while you are taking this medication without asking your care team for advice. Some ingredients may increase your blood pressure. What side effects may I notice from receiving this medication? Side effects that you should report to your care team as soon as possible: Allergic reactions--skin rash, itching, hives, swelling of the face, lips, tongue, or throat Headache, unusual weakness or fatigue, shortness of breath, nausea, vomiting, rapid heartbeat, blue skin or lips, which may be signs of methemoglobinemia Increased pressure around the brain--severe headache, blurry vision, change in vision, nausea, vomiting Low blood pressure--dizziness, feeling faint or lightheaded, blurry vision Slow heartbeat--dizziness, feeling faint or lightheaded, confusion, trouble breathing, unusual weakness or fatigue Worsening chest pain (angina)--pain, pressure, or tightness in the chest, neck, back, or arms Side effects that usually do not require medical attention (report to your care team if they continue or are bothersome): Dizziness Flushing Headache This list may not describe all possible side effects. Call your doctor for medical advice about side effects. You may report side effects to FDA at 1-800-FDA-1088. Where should I keep my medication? Keep out of the reach of children. Store at room temperature between 20 and 25 degrees C (68 and 77 degrees F). Store in [employer]. Protect from light and moisture. Keep tightly closed. Throw away any unused medication after the expiration date. NOTE: This sheet is a summary. It may not  cover all possible information. If you have questions about this medicine, talk to your doctor,  pharmacist, or health care provider.  2023 Elsevier/Gold Standard (2021-01-14 00:00:00)     Signed, Finis Bud, NP  11/22/2022 12:51 PM    [facility]

## 2022-11-22 NOTE — Telephone Encounter (Signed)
Checking percert on the following patient for testing scheduled at [facility].   LEXISCAN   12/07/2022

## 2022-12-05 ENCOUNTER — Ambulatory Visit: Payer: Medicare Other | Attending: Nurse Practitioner

## 2022-12-05 DIAGNOSIS — R0789 Other chest pain: Secondary | ICD-10-CM

## 2022-12-05 LAB — ECHOCARDIOGRAM COMPLETE
AR max vel: 3.56 cm2
AV Peak grad: 5.7 mmHg
Ao pk vel: 1.2 m/s
Area-P 1/2: 2.33 cm2
Calc EF: 59.9 %
P 1/2 time: 1965 msec
S' Lateral: 2.5 cm
Single Plane A2C EF: 61.9 %
Single Plane A4C EF: 61.8 %

## 2022-12-07 ENCOUNTER — Encounter (HOSPITAL_COMMUNITY): Payer: Self-pay

## 2022-12-07 ENCOUNTER — Ambulatory Visit (HOSPITAL_COMMUNITY)
Admission: RE | Admit: 2022-12-07 | Discharge: 2022-12-07 | Disposition: A | Discharge status: Home / Self Care | Payer: Medicare Other | Source: Ambulatory Visit | Attending: Cardiology | Admitting: Cardiology

## 2022-12-07 ENCOUNTER — Encounter (HOSPITAL_COMMUNITY)
Admission: RE | Admit: 2022-12-07 | Discharge: 2022-12-07 | Disposition: A | Discharge status: Home / Self Care | Payer: Medicare Other | Source: Ambulatory Visit | Attending: Nurse Practitioner | Admitting: Nurse Practitioner

## 2022-12-07 DIAGNOSIS — R0789 Other chest pain: Secondary | ICD-10-CM

## 2022-12-07 LAB — NM MYOCAR MULTI W/SPECT W/WALL MOTION / EF
LV dias vol: 75 mL (ref 62–150)
LV sys vol: 21 mL
Nuc Stress EF: 72 %
Peak HR: 104 {beats}/min
RATE: 0.4
Rest HR: 77 {beats}/min
Rest Nuclear Isotope Dose: 10.8 mCi
SDS: 4
SRS: 2
SSS: 6
ST Depression (mm): 0 mm
Stress Nuclear Isotope Dose: 31 mCi
TID: 1.06

## 2022-12-07 MED ORDER — SODIUM CHLORIDE FLUSH 0.9 % IV SOLN
INTRAVENOUS | Status: AC
  Administered 2022-12-07: 10 mL
  Filled 2022-12-07: qty 10

## 2022-12-07 MED ORDER — TECHNETIUM TC 99M TETROFOSMIN IV KIT
30.0000 | PACK | Freq: Once | INTRAVENOUS | Status: AC | PRN
  Administered 2022-12-07: 31 via INTRAVENOUS

## 2022-12-07 MED ORDER — REGADENOSON 0.4 MG/5ML IV SOLN
INTRAVENOUS | Status: AC
  Administered 2022-12-07: 0.4 mg
  Filled 2022-12-07: qty 5

## 2022-12-07 MED ORDER — TECHNETIUM TC 99M TETROFOSMIN IV KIT
10.0000 | PACK | Freq: Once | INTRAVENOUS | Status: AC | PRN
  Administered 2022-12-07: 10.8 via INTRAVENOUS

## 2023-01-02 DIAGNOSIS — M79674 Pain in right toe(s): Secondary | ICD-10-CM | POA: Diagnosis not present

## 2023-01-02 DIAGNOSIS — M79675 Pain in left toe(s): Secondary | ICD-10-CM | POA: Diagnosis not present

## 2023-01-02 DIAGNOSIS — M79671 Pain in right foot: Secondary | ICD-10-CM | POA: Diagnosis not present

## 2023-01-02 DIAGNOSIS — L609 Nail disorder, unspecified: Secondary | ICD-10-CM | POA: Diagnosis not present

## 2023-01-02 DIAGNOSIS — M79672 Pain in left foot: Secondary | ICD-10-CM | POA: Diagnosis not present

## 2023-01-02 DIAGNOSIS — I739 Peripheral vascular disease, unspecified: Secondary | ICD-10-CM | POA: Diagnosis not present

## 2023-01-24 ENCOUNTER — Encounter: Payer: Self-pay | Admitting: Nurse Practitioner

## 2023-01-24 ENCOUNTER — Ambulatory Visit: Payer: Medicare Other | Attending: Nurse Practitioner | Admitting: Nurse Practitioner

## 2023-01-24 VITALS — BP 108/74 | HR 83 | Ht 71.0 in | Wt 237.0 lb

## 2023-01-24 DIAGNOSIS — I10 Essential (primary) hypertension: Secondary | ICD-10-CM

## 2023-01-24 DIAGNOSIS — E669 Obesity, unspecified: Secondary | ICD-10-CM

## 2023-01-24 DIAGNOSIS — I480 Paroxysmal atrial fibrillation: Secondary | ICD-10-CM | POA: Diagnosis not present

## 2023-01-24 DIAGNOSIS — R0789 Other chest pain: Secondary | ICD-10-CM | POA: Diagnosis not present

## 2023-01-24 MED ORDER — APIXABAN 5 MG PO TABS: 5.0000 mg | ORAL_TABLET | Freq: Two times a day (BID) | ORAL | 0 refills | Status: DC

## 2023-01-24 NOTE — Patient Instructions (Signed)
Medication Instructions:  Your physician recommends that you continue on your current medications as directed. Please refer to the Current Medication list given to you today.   Labwork: none  Testing/Procedures: none  Follow-Up:  Your physician recommends that you schedule a follow-up appointment in: Mid to late October   Any Other Special Instructions Will Be Listed Below (If Applicable).  Please look into purchasing the Omron blood pressure cuff.  If you need a refill on your cardiac medications before your next appointment, please call your pharmacy.

## 2023-01-24 NOTE — Progress Notes (Signed)
Cardiology Office Note:    Date:  01/24/2023   ID:  Noah Mccarthy, DOB September 26, 1941, MRN [id number]  PCP:  Manon Hilding, MD   [facility] Providers Cardiologist:  Carlyle Dolly, MD     Referring MD: Manon Hilding, MD   CC: Chest pain follow-up  History of Present Illness:    Noah Mccarthy is a 82 y.o. male with a hx of the following:  PAF HLD Hx of prostate cancer HTN   Dx with A-fib in 2013. NST in 2016 that showed normal perfusion without ischemia.  For his history of atrial fibrillation, has been managed with diltiazem as needed only, unable to tolerate Metoprolol.  Repeat NST in January 2024 was normal, low risk.  TTE revealed EF 60 to 65%, grade 1 DD, mild AR, no significant valvular abnormalities.  Today he presents for follow-up.  Doing well. Denies any chest pain, shortness of breath, palpitations, syncope, presyncope, dizziness, orthopnea, PND, swelling or significant weight changes, acute bleeding, or claudication.  Does admit to some fatigue, blood pressures are running soft, asymptomatic with this.  SH: Very active and has property in [place].    Past Medical History:  Diagnosis Date   Chest tightness    With rapid atrial fibrillation, June, 2013, stress echo normal   Ejection fraction    EF 60%, echo, June, 2013   Hyperlipidemia    Paroxysmal atrial fibrillation ([place])    Hospitalization in June, 2013, spontaneous conversion to normal sinus rhythm, CHADS score is 0, therefore anticoagulation has not been recommended   Prostate cancer [facility])    Sinus tachycardia    Mild, office , December, 2013, no symptoms    Past Surgical History:  Procedure Laterality Date   PROSTATE SURGERY     For prostate cancer    Current Medications: Current Meds  Medication Sig   apixaban (ELIQUIS) 5 MG TABS tablet TAKE 1 TABLET BY MOUTH TWICE A DAY   atorvastatin (LIPITOR) 10 MG tablet Take 10 mg by mouth every other day.   nitroGLYCERIN (NITROSTAT)  0.4 MG SL tablet Place 1 tablet (0.4 mg total) under the tongue every 5 (five) minutes as needed for chest pain.   diltiazem (CARDIZEM) 30 MG tablet Take 1 tablet (30 mg total) by mouth 2 (two) times daily as needed (Palpitations).     Allergies:   Patient has no known allergies.   Social History   Socioeconomic History   Marital status: Unknown    Spouse name: Not on file   Number of children: Not on file   Years of education: Not on file   Highest education level: Not on file  Occupational History   Not on file  Tobacco Use   Smoking status: Never    Passive exposure: Never   Smokeless tobacco: Never  Vaping Use   Vaping Use: Never used  Substance and Sexual Activity   Alcohol use: Yes    Alcohol/week: 0.0 standard drinks of alcohol    Comment: 1 beverage for dinner   Drug use: No   Sexual activity: Not on file  Other Topics Concern   Not on file  Social History Narrative   Not on file   Social Determinants of Health   Financial Resource Strain: Not on file  Food Insecurity: Not on file  Transportation Needs: Not on file  Physical Activity: Not on file  Stress: Not on file  Social Connections: Not on file     Family History:  The patient's family history includes Heart attack (age of onset: 2) in his brother.  ROS:     Please see the history of present illness.    All other systems reviewed and are negative.  EKGs/Labs/Other Studies Reviewed:    The following studies were reviewed today:   EKG:  EKG is not ordered today.   Lexiscan on December 07, 2022:   Findings are consistent with no ischemia. The study is low risk.   No ST deviation was noted. The ECG was negative for ischemia.   LV perfusion is normal.  Diaphragmatic attenuation noted with no large, reversible perfusion defects to indicate ischemia.   Left ventricular function is normal. Nuclear stress EF: 72 %.   Low risk study with no significant ischemia and LVEF 72%.   TTE on December 05, 2022:  1. Left ventricular ejection fraction, by estimation, is 60 to 65%. The  left ventricle has normal function. Left ventricular endocardial border  not optimally defined to evaluate regional wall motion. Left ventricular  diastolic parameters are consistent  with Grade I diastolic dysfunction (impaired relaxation). The average left  ventricular global longitudinal strain is -25.5 %. The global longitudinal  strain is normal.   2. Right ventricular systolic function is low normal. The right  ventricular size is normal.   3. The mitral valve was not well visualized. No evidence of mitral valve  regurgitation. No evidence of mitral stenosis.   4. The aortic valve is tricuspid. Aortic valve regurgitation is mild. No  aortic stenosis is present.   5. The inferior vena cava is normal in size with greater than 50%  respiratory variability, suggesting right atrial pressure of 3 mmHg.   Comparison(s): No prior Echocardiogram.  Lexiscan on 09/09/2015: Blood pressure demonstrated a hypertensive response to exercise. There was no ST segment deviation noted during stress. Poor exercise tolerance with Duke treadmill score of 3 portends a moderate risk for cardiac events. Clinical correlation indicated. Normal myocardial perfusion. Nuclear stress EF: 75%.  Stress Echo on 04/16/2012: Normal  Echocardiogram on 04/10/2012: EF 60%, no RWMA, grade 1 DD, mild LVH, no significant valvular abnormalities.   Recent Labs: No results found for requested labs within last 365 days.  Recent Lipid Panel No results found for: "CHOL", "TRIG", "HDL", "CHOLHDL", "VLDL", "LDLCALC", "LDLDIRECT"   Risk Assessment/Calculations:    CHA2DS2-VASc Score = 2  This indicates a 2.2% annual risk of stroke. The patient's score is based upon: CHF History: 0 HTN History: 0 Diabetes History: 0 Stroke History: 0 Vascular Disease History: 0 Age Score: 2 Gender Score: 0    STOP-Bang Score:  4  {  Physical Exam:     VS:  BP 108/74 (BP Location: Right Arm, Patient Position: Sitting, Cuff Size: Large)   Pulse 83   Ht '5\' 11"'$  (1.803 m)   Wt 237 lb (107.5 kg)   SpO2 97%   BMI 33.05 kg/m     Wt Readings from Last 3 Encounters:  01/24/23 237 lb (107.5 kg)  11/22/22 231 lb (104.8 kg)  08/31/22 228 lb 3.2 oz (103.5 kg)     GEN: Obese, 82 year old male in no acute distress HEENT: Normal NECK: No JVD; No carotid bruits LYMPHATICS: No lymphadenopathy CARDIAC: S1/S2, RRR, no murmurs, rubs, gallops; 2+ pulses RESPIRATORY:  Clear to auscultation without rales, wheezing or rhonchi  MUSCULOSKELETAL:  No edema; No deformity  SKIN: Warm and dry NEUROLOGIC:  Alert and oriented x 3 PSYCHIATRIC:  Normal affect   ASSESSMENT:  1. Atypical chest pain   2. Paroxysmal atrial fibrillation (HCC)   3. Essential hypertension, benign   4. Obesity (BMI 30-39.9)    PLAN:    In order of problems listed above:  Atypical chest pain Recent NST negative for ischemia, low risk.  Etiology is likely noncardiac.  Continue current medication regimen.  ED precautions discussed. Heart healthy diet and regular cardiovascular exercise encouraged.   2.  PAF Rare palpitations, brief in duration.  Discussed appropriate dosage/how to take diltiazem.  He verbalized understanding.  Continue diltiazem as needed.  Doing well on Eliquis, on appropriate dosage.  Will provide samples for him today.  Discussed to avoid triggers to A-fib.  STOP-BANG score today is 4.  He has never been tested for OSA, and he is at high risk, politely declines sleep study at this time.  Will revisit at next office visit.  3.  HTN Blood pressures have been soft, asymptomatic.  Stated a few times has dropped in upper 0000000 systolic, also asymptomatic.  Continue current medication regimen.  Given BP log and discussed to monitor BP at home at least 2 hours after medications and sitting for 5-10 minutes.  Discussed to bring BP log to next office visit. Heart  healthy diet and regular cardiovascular exercise encouraged. Consider midodrine at next OV if symptomatic.   4.  Obesity BMI today 33.05. Weight loss via diet and exercise encouraged. Discussed the impact being overweight would have on cardiovascular risk. Heart healthy diet and regular cardiovascular exercise encouraged.  He is very active, and I encouraged him to increase his physical activity to walk 30 minutes daily, and he verbalized understanding.  5.  Disposition: Will be going to [place] soon.  Follow-up with Dr. Carlyle Dolly or APP in mid to late October or sooner if anything changes.  Medication Adjustments/Labs and Tests Ordered: Current medicines are reviewed at length with the patient today.  Concerns regarding medicines are outlined above.  No orders of the defined types were placed in this encounter.  Meds ordered this encounter  Medications   apixaban (ELIQUIS) 5 MG TABS tablet    Sig: Take 1 tablet (5 mg total) by mouth 2 (two) times daily.    Dispense:  42 tablet    Refill:  0    Lot # [id number] Exp 04/2024    Patient Instructions  Medication Instructions:  Your physician recommends that you continue on your current medications as directed. Please refer to the Current Medication list given to you today.   Labwork: none  Testing/Procedures: none  Follow-Up:  Your physician recommends that you schedule a follow-up appointment in: Mid to late October   Any Other Special Instructions Will Be Listed Below (If Applicable).  Please look into purchasing the [employer] blood pressure cuff.  If you need a refill on your cardiac medications before your next appointment, please call your pharmacy.    SignedFinis Bud, NP  01/24/2023 9:35 AM    [facility]

## 2023-02-23 DIAGNOSIS — E7849 Other hyperlipidemia: Secondary | ICD-10-CM | POA: Diagnosis not present

## 2023-02-23 DIAGNOSIS — I10 Essential (primary) hypertension: Secondary | ICD-10-CM | POA: Diagnosis not present

## 2023-02-23 DIAGNOSIS — R7301 Impaired fasting glucose: Secondary | ICD-10-CM | POA: Diagnosis not present

## 2023-02-23 DIAGNOSIS — E539 Vitamin B deficiency, unspecified: Secondary | ICD-10-CM | POA: Diagnosis not present

## 2023-03-02 DIAGNOSIS — R739 Hyperglycemia, unspecified: Secondary | ICD-10-CM | POA: Diagnosis not present

## 2023-03-02 DIAGNOSIS — Z23 Encounter for immunization: Secondary | ICD-10-CM | POA: Diagnosis not present

## 2023-03-02 DIAGNOSIS — R030 Elevated blood-pressure reading, without diagnosis of hypertension: Secondary | ICD-10-CM | POA: Diagnosis not present

## 2023-03-02 DIAGNOSIS — I482 Chronic atrial fibrillation, unspecified: Secondary | ICD-10-CM | POA: Diagnosis not present

## 2023-03-02 DIAGNOSIS — D519 Vitamin B12 deficiency anemia, unspecified: Secondary | ICD-10-CM | POA: Diagnosis not present

## 2023-03-02 DIAGNOSIS — E7849 Other hyperlipidemia: Secondary | ICD-10-CM | POA: Diagnosis not present

## 2023-03-02 DIAGNOSIS — Z0001 Encounter for general adult medical examination with abnormal findings: Secondary | ICD-10-CM | POA: Diagnosis not present

## 2023-03-02 DIAGNOSIS — R5383 Other fatigue: Secondary | ICD-10-CM | POA: Diagnosis not present

## 2023-03-06 ENCOUNTER — Ambulatory Visit: Payer: Medicare Other | Admitting: Cardiology

## 2023-03-07 DIAGNOSIS — H353132 Nonexudative age-related macular degeneration, bilateral, intermediate dry stage: Secondary | ICD-10-CM | POA: Diagnosis not present

## 2023-03-07 DIAGNOSIS — H524 Presbyopia: Secondary | ICD-10-CM | POA: Diagnosis not present

## 2023-03-09 ENCOUNTER — Ambulatory Visit: Payer: Medicare Other | Attending: Nurse Practitioner | Admitting: Nurse Practitioner

## 2023-03-09 ENCOUNTER — Encounter: Payer: Self-pay | Admitting: Nurse Practitioner

## 2023-03-09 VITALS — BP 140/82 | HR 99 | Ht 71.0 in | Wt 234.0 lb

## 2023-03-09 DIAGNOSIS — I10 Essential (primary) hypertension: Secondary | ICD-10-CM | POA: Diagnosis not present

## 2023-03-09 DIAGNOSIS — E669 Obesity, unspecified: Secondary | ICD-10-CM

## 2023-03-09 DIAGNOSIS — I480 Paroxysmal atrial fibrillation: Secondary | ICD-10-CM | POA: Diagnosis not present

## 2023-03-09 MED ORDER — DILTIAZEM HCL 30 MG PO TABS: 30.0000 mg | ORAL_TABLET | Freq: Two times a day (BID) | ORAL | 1 refills | Status: AC

## 2023-03-09 NOTE — Progress Notes (Signed)
Cardiology Office Note:    Date: 03/09/2023  ID:  Noah Mccarthy, DOB 07-29-1941, MRN [id number]  PCP:  Noah Pandy, MD   [facility] Providers Cardiologist:  Noah Rich, MD     Referring MD: Noah Pandy, MD   CC: Here for follow-up  History of Present Illness:    Noah Mccarthy is a 82 y.o. male with a hx of the following:  PAF HLD Hx of prostate cancer HTN   Dx with A-fib in 2013. NST in 2016 that showed normal perfusion without ischemia.  For his history of atrial fibrillation, has been managed with diltiazem as needed only, unable to tolerate Metoprolol.  Repeat NST in January 2024 was normal, low risk.  TTE revealed EF 60 to 65%, grade 1 DD, mild AR, no significant valvular abnormalities.  Today he presents for follow-up.  Doing well, however had an episode of A-fib, took approximately 4 tablets of short acting diltiazem to reduce HR, episode lasted approximately 4 hours, hasn't recurred since. Just had labs done with Dr. Neita Mccarthy. Denies any chest pain, shortness of breath, syncope, presyncope, dizziness, orthopnea, PND, swelling or significant weight changes, acute bleeding, or claudication.    SH: Very active and has property in [place].    Past Medical History:  Diagnosis Date   Chest tightness    With rapid atrial fibrillation, June, 2013, stress echo normal   Ejection fraction    EF 60%, echo, June, 2013   Hyperlipidemia    Paroxysmal atrial fibrillation ([facility])    Hospitalization in June, 2013, spontaneous conversion to normal sinus rhythm, CHADS score is 0, therefore anticoagulation has not been recommended   Prostate cancer [facility])    Sinus tachycardia    Mild, office , December, 2013, no symptoms    Past Surgical History:  Procedure Laterality Date   PROSTATE SURGERY     For prostate cancer    Current Medications: Current Meds  Medication Sig   apixaban (ELIQUIS) 5 MG TABS tablet TAKE 1 TABLET BY MOUTH TWICE A DAY    atorvastatin (LIPITOR) 10 MG tablet Take 10 mg by mouth every other day.   nitroGLYCERIN (NITROSTAT) 0.4 MG SL tablet Place 1 tablet (0.4 mg total) under the tongue every 5 (five) minutes as needed for chest pain.   diltiazem (CARDIZEM) 30 MG tablet Take 1 tablet (30 mg total) by mouth 2 (two) times daily as needed (Palpitations).     Allergies:   Patient has no known allergies.   Social History   Socioeconomic History   Marital status: Unknown    Spouse name: Not on file   Number of children: Not on file   Years of education: Not on file   Highest education level: Not on file  Occupational History   Not on file  Tobacco Use   Smoking status: Never    Passive exposure: Never   Smokeless tobacco: Never  Vaping Use   Vaping Use: Never used  Substance and Sexual Activity   Alcohol use: Yes    Alcohol/week: 0.0 standard drinks of alcohol    Comment: 1 beverage for dinner   Drug use: No   Sexual activity: Not on file  Other Topics Concern   Not on file  Social History Narrative   Not on file   Social Determinants of Health   Financial Resource Strain: Not on file  Food Insecurity: Not on file  Transportation Needs: Not on file  Physical Activity: Not on file  Stress: Not on file  Social Connections: Not on file     Family History: The patient's family history includes Heart attack (age of onset: 53) in his brother.  ROS:     Please see the history of present illness.    All other systems reviewed and are negative.  EKGs/Labs/Other Studies Reviewed:    The following studies were reviewed today:   EKG:  EKG is not ordered today.   Lexiscan on December 07, 2022:   Findings are consistent with no ischemia. The study is low risk.   No ST deviation was noted. The ECG was negative for ischemia.   LV perfusion is normal.  Diaphragmatic attenuation noted with no large, reversible perfusion defects to indicate ischemia.   Left ventricular function is normal. Nuclear  stress EF: 72 %.   Low risk study with no significant ischemia and LVEF 72%.   TTE on December 05, 2022:  1. Left ventricular ejection fraction, by estimation, is 60 to 65%. The  left ventricle has normal function. Left ventricular endocardial border  not optimally defined to evaluate regional wall motion. Left ventricular  diastolic parameters are consistent  with Grade I diastolic dysfunction (impaired relaxation). The average left  ventricular global longitudinal strain is -25.5 %. The global longitudinal  strain is normal.   2. Right ventricular systolic function is low normal. The right  ventricular size is normal.   3. The mitral valve was not well visualized. No evidence of mitral valve  regurgitation. No evidence of mitral stenosis.   4. The aortic valve is tricuspid. Aortic valve regurgitation is mild. No  aortic stenosis is present.   5. The inferior vena cava is normal in size with greater than 50%  respiratory variability, suggesting right atrial pressure of 3 mmHg.   Comparison(s): No prior Echocardiogram.  Lexiscan on 09/09/2015: Blood pressure demonstrated a hypertensive response to exercise. There was no ST segment deviation noted during stress. Poor exercise tolerance with Duke treadmill score of 3 portends a moderate risk for cardiac events. Clinical correlation indicated. Normal myocardial perfusion. Nuclear stress EF: 75%.  Stress Echo on 04/16/2012: Normal  Echocardiogram on 04/10/2012: EF 60%, no RWMA, grade 1 DD, mild LVH, no significant valvular abnormalities.   Recent Labs: No results found for requested labs within last 365 days.  Recent Lipid Panel No results found for: "CHOL", "TRIG", "HDL", "CHOLHDL", "VLDL", "LDLCALC", "LDLDIRECT"   Risk Assessment/Calculations:    CHA2DS2-VASc Score = 2  This indicates a 2.2% annual risk of stroke. The patient's score is based upon: CHF History: 0 HTN History: 0 Diabetes History: 0 Stroke History:  0 Vascular Disease History: 0 Age Score: 2 Gender Score: 0    STOP-Bang Score:  4  {  Physical Exam:    VS:  BP (!) 140/82 (BP Location: Left Arm, Patient Position: Sitting, Cuff Size: Large)   Pulse 99   Ht 5\' 11"  (1.803 m)   Wt 234 lb (106.1 kg)   SpO2 97%   BMI 32.64 kg/m     Wt Readings from Last 3 Encounters:  03/09/23 234 lb (106.1 kg)  01/24/23 237 lb (107.5 kg)  11/22/22 231 lb (104.8 kg)     GEN: Obese, 82 year old male in no acute distress HEENT: Normal NECK: No JVD; No carotid bruits LYMPHATICS: No lymphadenopathy CARDIAC: S1/S2, RRR, no murmurs, rubs, gallops; 2+ pulses RESPIRATORY:  Clear to auscultation without rales, wheezing or rhonchi  MUSCULOSKELETAL:  No edema; No deformity  SKIN: Warm and dry  NEUROLOGIC:  Alert and oriented x 3 PSYCHIATRIC:  Normal affect   ASSESSMENT:    1. PAF (paroxysmal atrial fibrillation) (HCC)   2. Essential hypertension, benign   3. Obesity (BMI 30-39.9)     PLAN:    In order of problems listed above:  1.  PAF Recent episode of sustained palpitations, relieved after taking 4 tablets of short acting diltiazem.  Discussed appropriate dosage/how to take diltiazem. Reviewed Dr. Verna Mccarthy previous note that stated had good control on diltiazem 30 mg BID, therefore will have him take short acting Cardizem 30 mg BID, and may take extra tablet daily PRN for breakthrough palpitations. Discussed to avoid triggers to A-fib.  STOP-BANG score today is 4.  He has never been tested for OSA, and he is at high risk, declines sleep study at this time.  Will request labs from PCP office. Recommended [facility] mobile app.   3.  HTN Blood pressures mildly elevated, have been soft in past. Given BP log and discussed to monitor BP at home at least 2 hours after medications and sitting for 5-10 minutes.  Heart healthy diet and regular cardiovascular exercise encouraged. Adjusting Cardizem as mentioned above.   4.  Obesity BMI today 32.64. Weight  loss via diet and exercise encouraged. Discussed the impact being overweight would have on cardiovascular risk. Heart healthy diet and regular cardiovascular exercise encouraged.  He is very active, and I encouraged him to increase his physical activity to walk 30 minutes daily, and he verbalized understanding.  5.  Disposition:   Follow-up with Dr. Dina Mccarthy or APP in 6 months or sooner if anything changes.   Medication Adjustments/Labs and Tests Ordered: Current medicines are reviewed at length with the patient today.  Concerns regarding medicines are outlined above.  No orders of the defined types were placed in this encounter.  Meds ordered this encounter  Medications   diltiazem (CARDIZEM) 30 MG tablet    Sig: Take 1 tablet (30 mg total) by mouth 2 (two) times daily. May take additional 30 mg tablet daily as needed for palpitations.    Dispense:  180 tablet    Refill:  1    Patient Instructions  Medication Instructions:  Your physician has recommended you make the following change in your medication:  Take diltiazem 30 mg twice a day, may take additional tablet as needed for palpitations Continue all medications as directed  Labwork: Labs requested from PCP  Testing/Procedures: none  Follow-Up:  Your physician recommends that you schedule a follow-up appointment in: 6 months  Any Other Special Instructions Will Be Listed Below (If Applicable).  If you need a refill on your cardiac medications before your next appointment, please call your pharmacy.    Signed, Noah Dory, NP  03/10/2023 5:13 PM    [facility]

## 2023-03-09 NOTE — Patient Instructions (Signed)
Medication Instructions:  Your physician has recommended you make the following change in your medication:  Take diltiazem 30 mg twice a day, may take additional tablet as needed for palpitations Continue all medications as directed  Labwork: Labs requested from PCP  Testing/Procedures: none  Follow-Up:  Your physician recommends that you schedule a follow-up appointment in: 6 months  Any Other Special Instructions Will Be Listed Below (If Applicable).  If you need a refill on your cardiac medications before your next appointment, please call your pharmacy.

## 2023-03-13 DIAGNOSIS — L609 Nail disorder, unspecified: Secondary | ICD-10-CM | POA: Diagnosis not present

## 2023-03-13 DIAGNOSIS — M79672 Pain in left foot: Secondary | ICD-10-CM | POA: Diagnosis not present

## 2023-03-13 DIAGNOSIS — M79674 Pain in right toe(s): Secondary | ICD-10-CM | POA: Diagnosis not present

## 2023-03-13 DIAGNOSIS — I739 Peripheral vascular disease, unspecified: Secondary | ICD-10-CM | POA: Diagnosis not present

## 2023-03-13 DIAGNOSIS — M79675 Pain in left toe(s): Secondary | ICD-10-CM | POA: Diagnosis not present

## 2023-03-13 DIAGNOSIS — M79671 Pain in right foot: Secondary | ICD-10-CM | POA: Diagnosis not present

## 2023-05-16 ENCOUNTER — Other Ambulatory Visit: Payer: Self-pay | Admitting: Cardiology

## 2023-05-16 DIAGNOSIS — I480 Paroxysmal atrial fibrillation: Secondary | ICD-10-CM

## 2023-05-16 NOTE — Telephone Encounter (Signed)
Eliquis 5mg  refill request received. Patient is 82 years old, weight-106.1kg, Crea-1.02 on 02/23/23 via scanned labs from [facility], Diagnosis-Afib, and last seen by Sharlene Dory on 03/09/23. Dose is appropriate based on dosing criteria. Will send in refill to requested pharmacy.

## 2023-05-29 DIAGNOSIS — M79674 Pain in right toe(s): Secondary | ICD-10-CM | POA: Diagnosis not present

## 2023-05-29 DIAGNOSIS — M79671 Pain in right foot: Secondary | ICD-10-CM | POA: Diagnosis not present

## 2023-05-29 DIAGNOSIS — M79675 Pain in left toe(s): Secondary | ICD-10-CM | POA: Diagnosis not present

## 2023-05-29 DIAGNOSIS — I739 Peripheral vascular disease, unspecified: Secondary | ICD-10-CM | POA: Diagnosis not present

## 2023-05-29 DIAGNOSIS — L609 Nail disorder, unspecified: Secondary | ICD-10-CM | POA: Diagnosis not present

## 2023-05-29 DIAGNOSIS — M79672 Pain in left foot: Secondary | ICD-10-CM | POA: Diagnosis not present

## 2023-06-12 DIAGNOSIS — R5383 Other fatigue: Secondary | ICD-10-CM | POA: Diagnosis not present

## 2023-06-12 DIAGNOSIS — E7849 Other hyperlipidemia: Secondary | ICD-10-CM | POA: Diagnosis not present

## 2023-06-12 DIAGNOSIS — I10 Essential (primary) hypertension: Secondary | ICD-10-CM | POA: Diagnosis not present

## 2023-06-12 DIAGNOSIS — E039 Hypothyroidism, unspecified: Secondary | ICD-10-CM | POA: Diagnosis not present

## 2023-06-12 DIAGNOSIS — I4891 Unspecified atrial fibrillation: Secondary | ICD-10-CM | POA: Diagnosis not present

## 2023-06-12 DIAGNOSIS — E559 Vitamin D deficiency, unspecified: Secondary | ICD-10-CM | POA: Diagnosis not present

## 2023-08-07 DIAGNOSIS — I739 Peripheral vascular disease, unspecified: Secondary | ICD-10-CM | POA: Diagnosis not present

## 2023-08-07 DIAGNOSIS — M79671 Pain in right foot: Secondary | ICD-10-CM | POA: Diagnosis not present

## 2023-08-07 DIAGNOSIS — L609 Nail disorder, unspecified: Secondary | ICD-10-CM | POA: Diagnosis not present

## 2023-08-07 DIAGNOSIS — M79675 Pain in left toe(s): Secondary | ICD-10-CM | POA: Diagnosis not present

## 2023-08-07 DIAGNOSIS — M79672 Pain in left foot: Secondary | ICD-10-CM | POA: Diagnosis not present

## 2023-08-07 DIAGNOSIS — M79674 Pain in right toe(s): Secondary | ICD-10-CM | POA: Diagnosis not present

## 2023-08-09 DIAGNOSIS — R251 Tremor, unspecified: Secondary | ICD-10-CM | POA: Diagnosis not present

## 2023-08-15 DIAGNOSIS — G250 Essential tremor: Secondary | ICD-10-CM | POA: Diagnosis not present

## 2023-08-15 DIAGNOSIS — I10 Essential (primary) hypertension: Secondary | ICD-10-CM | POA: Diagnosis not present

## 2023-08-15 DIAGNOSIS — I4891 Unspecified atrial fibrillation: Secondary | ICD-10-CM | POA: Diagnosis not present

## 2023-08-15 DIAGNOSIS — R251 Tremor, unspecified: Secondary | ICD-10-CM | POA: Diagnosis not present

## 2023-08-15 DIAGNOSIS — Z23 Encounter for immunization: Secondary | ICD-10-CM | POA: Diagnosis not present

## 2023-08-21 DIAGNOSIS — D2239 Melanocytic nevi of other parts of face: Secondary | ICD-10-CM | POA: Diagnosis not present

## 2023-08-21 DIAGNOSIS — L570 Actinic keratosis: Secondary | ICD-10-CM | POA: Diagnosis not present

## 2023-08-21 DIAGNOSIS — L821 Other seborrheic keratosis: Secondary | ICD-10-CM | POA: Diagnosis not present

## 2023-08-21 DIAGNOSIS — D485 Neoplasm of uncertain behavior of skin: Secondary | ICD-10-CM | POA: Diagnosis not present

## 2023-08-24 DIAGNOSIS — I10 Essential (primary) hypertension: Secondary | ICD-10-CM | POA: Diagnosis not present

## 2023-08-24 DIAGNOSIS — E7849 Other hyperlipidemia: Secondary | ICD-10-CM | POA: Diagnosis not present

## 2023-08-24 DIAGNOSIS — R7301 Impaired fasting glucose: Secondary | ICD-10-CM | POA: Diagnosis not present

## 2023-08-31 DIAGNOSIS — D519 Vitamin B12 deficiency anemia, unspecified: Secondary | ICD-10-CM | POA: Diagnosis not present

## 2023-08-31 DIAGNOSIS — R739 Hyperglycemia, unspecified: Secondary | ICD-10-CM | POA: Diagnosis not present

## 2023-08-31 DIAGNOSIS — R5383 Other fatigue: Secondary | ICD-10-CM | POA: Diagnosis not present

## 2023-08-31 DIAGNOSIS — R030 Elevated blood-pressure reading, without diagnosis of hypertension: Secondary | ICD-10-CM | POA: Diagnosis not present

## 2023-08-31 DIAGNOSIS — R251 Tremor, unspecified: Secondary | ICD-10-CM | POA: Diagnosis not present

## 2023-08-31 DIAGNOSIS — Z23 Encounter for immunization: Secondary | ICD-10-CM | POA: Diagnosis not present

## 2023-08-31 DIAGNOSIS — E7849 Other hyperlipidemia: Secondary | ICD-10-CM | POA: Diagnosis not present

## 2023-09-14 ENCOUNTER — Ambulatory Visit: Payer: Medicare Other | Attending: Cardiology | Admitting: Cardiology

## 2023-09-14 ENCOUNTER — Encounter: Payer: Self-pay | Admitting: Cardiology

## 2023-09-14 VITALS — BP 132/78 | HR 76 | Ht 71.0 in | Wt 227.4 lb

## 2023-09-14 DIAGNOSIS — I480 Paroxysmal atrial fibrillation: Secondary | ICD-10-CM | POA: Diagnosis not present

## 2023-09-14 DIAGNOSIS — R0789 Other chest pain: Secondary | ICD-10-CM | POA: Diagnosis not present

## 2023-09-14 DIAGNOSIS — D6869 Other thrombophilia: Secondary | ICD-10-CM

## 2023-09-14 NOTE — Patient Instructions (Signed)
Medication Instructions:  Continue all current medications.   Labwork: none  Testing/Procedures: none  Follow-Up: 6 months   Any Other Special Instructions Will Be Listed Below (If Applicable).   If you need a refill on your cardiac medications before your next appointment, please call your pharmacy.  

## 2023-09-14 NOTE — Progress Notes (Signed)
Clinical Summary Noah Mccarthy is a 82 y.o.male seen today for follow up of the following medical problems.      1. History of chest pain - 08/2015 exercise nuclear stress showed no ST/T changes, poor exercise tolerance, normal perfusion with no ischemia.   - Jan 2024 nuclear stress no ischemia  - no recent symptoms.     2. Afib - lopressor has caused fatigue in the past, changed to diltiazem - no recent palpitations - compliant with meds - no bleeding on eliquis.         4. HTN - he is compliant with meds    5. Generalized fatigue - naps often.  +snoring, +daytime somnolence.  - has been reluctant to consider sleep study  6. Tremors - ER visit 07/2023 - has pcp f/u, considering seeing neurology   SH: Has a fishing place in [place], just got back from trip  Past Medical History:  Diagnosis Date   Chest tightness    With rapid atrial fibrillation, June, 2013, stress echo normal   Ejection fraction    EF 60%, echo, June, 2013   Hyperlipidemia    Paroxysmal atrial fibrillation (HCC)    Hospitalization in June, 2013, spontaneous conversion to normal sinus rhythm, CHADS score is 0, therefore anticoagulation has not been recommended   Prostate cancer (HCC)    Sinus tachycardia    Mild, office , December, 2013, no symptoms     No Known Allergies   Current Outpatient Medications  Medication Sig Dispense Refill   atorvastatin (LIPITOR) 10 MG tablet Take 10 mg by mouth every other day.     diltiazem (CARDIZEM) 30 MG tablet Take 1 tablet (30 mg total) by mouth 2 (two) times daily. May take additional 30 mg tablet daily as needed for palpitations. 180 tablet 1   ELIQUIS 5 MG TABS tablet TAKE 1 TABLET BY MOUTH TWICE A DAY 180 tablet 1   nitroGLYCERIN (NITROSTAT) 0.4 MG SL tablet Place 1 tablet (0.4 mg total) under the tongue every 5 (five) minutes as needed for chest pain. 90 tablet 3   No current facility-administered medications for this visit.     Past  Surgical History:  Procedure Laterality Date   PROSTATE SURGERY     For prostate cancer     No Known Allergies    Family History  Problem Relation Age of Onset   Heart attack Brother 4       cause of death     Social History Mr. Miramon reports that he has never smoked. He has never been exposed to tobacco smoke. He has never used smokeless tobacco. Mr. Woitas reports current alcohol use.   Review of Systems CONSTITUTIONAL: No weight loss, fever, chills, weakness or fatigue.  HEENT: Eyes: No visual loss, blurred vision, double vision or yellow sclerae.No hearing loss, sneezing, congestion, runny nose or sore throat.  SKIN: No rash or itching.  CARDIOVASCULAR: per hpi RESPIRATORY: No shortness of breath, cough or sputum.  GASTROINTESTINAL: No anorexia, nausea, vomiting or diarrhea. No abdominal pain or blood.  GENITOURINARY: No burning on urination, no polyuria NEUROLOGICAL: No headache, dizziness, syncope, paralysis, ataxia, numbness or tingling in the extremities. No change in bowel or bladder control.  MUSCULOSKELETAL: No muscle, back pain, joint pain or stiffness.  LYMPHATICS: No enlarged nodes. No history of splenectomy.  PSYCHIATRIC: No history of depression or anxiety.  ENDOCRINOLOGIC: No reports of sweating, cold or heat intolerance. No polyuria or polydipsia.  Marland Kitchen  Physical Examination Today's Vitals   09/14/23 1036  BP: 132/78  Pulse: 76  SpO2: 95%  Weight: 227 lb 6.4 oz (103.1 kg)  Height: 5\' 11"  (1.803 m)   Body mass index is 31.72 kg/m.  Gen: resting comfortably, no acute distress HEENT: no scleral icterus, pupils equal round and reactive, no palptable cervical adenopathy,  CV: RRR, no m/rg no jvd Resp: Clear to auscultation bilaterally GI: abdomen is soft, non-tender, non-distended, normal bowel sounds, no hepatosplenomegaly MSK: extremities are warm, no edema.  Skin: warm, no rash Neuro:  no focal deficits Psych: appropriate  affect   Diagnostic Studies   08/2015 Exercise MPI Blood pressure demonstrated a hypertensive response to exercise. There was no ST segment deviation noted during stress. Poor exercise tolerance with Duke treadmill score of 3 portends a moderate risk for cardiac events. Clinical correlation indicated. Normal myocardial perfusion. Nuclear stress EF: 75%.     03/2012 echo LVEF 60%, grade I diastolic dysfunction    Jan 2024 nuclear stress  Findings are consistent with no ischemia. The study is low risk.   No ST deviation was noted. The ECG was negative for ischemia.   LV perfusion is normal.  Diaphragmatic attenuation noted with no large, reversible perfusion defects to indicate ischemia.   Left ventricular function is normal. Nuclear stress EF: 72 %.   Jan 2024 echo 1. Left ventricular ejection fraction, by estimation, is 60 to 65%. The  left ventricle has normal function. Left ventricular endocardial border  not optimally defined to evaluate regional wall motion. Left ventricular  diastolic parameters are consistent  with Grade I diastolic dysfunction (impaired relaxation). The average left  ventricular global longitudinal strain is -25.5 %. The global longitudinal  strain is normal.   2. Right ventricular systolic function is low normal. The right  ventricular size is normal.   3. The mitral valve was not well visualized. No evidence of mitral valve  regurgitation. No evidence of mitral stenosis.   4. The aortic valve is tricuspid. Aortic valve regurgitation is mild. No  aortic stenosis is present.   5. The inferior vena cava is normal in size with greater than 50%  respiratory variability, suggesting right atrial pressure of 3 mmHg.   Assessment and Plan   1. PAF/acquired thrombophilia -no symptoms, continue current meds incliuding eliquis for stroke prevention   2. Chest pain - no recent symptoms, stress testing over the years including Jan 2024 has been benign -  continue to monitor.     Antoine Poche, M.D.

## 2023-10-04 DIAGNOSIS — R030 Elevated blood-pressure reading, without diagnosis of hypertension: Secondary | ICD-10-CM | POA: Diagnosis not present

## 2023-10-04 DIAGNOSIS — G250 Essential tremor: Secondary | ICD-10-CM | POA: Diagnosis not present

## 2023-11-25 ENCOUNTER — Other Ambulatory Visit: Payer: Self-pay | Admitting: Cardiology

## 2023-11-25 DIAGNOSIS — I480 Paroxysmal atrial fibrillation: Secondary | ICD-10-CM

## 2023-11-27 DIAGNOSIS — I739 Peripheral vascular disease, unspecified: Secondary | ICD-10-CM | POA: Diagnosis not present

## 2023-11-27 DIAGNOSIS — L609 Nail disorder, unspecified: Secondary | ICD-10-CM | POA: Diagnosis not present

## 2023-11-27 DIAGNOSIS — M79672 Pain in left foot: Secondary | ICD-10-CM | POA: Diagnosis not present

## 2023-11-27 DIAGNOSIS — M79674 Pain in right toe(s): Secondary | ICD-10-CM | POA: Diagnosis not present

## 2023-11-27 DIAGNOSIS — M79671 Pain in right foot: Secondary | ICD-10-CM | POA: Diagnosis not present

## 2023-11-27 DIAGNOSIS — M79675 Pain in left toe(s): Secondary | ICD-10-CM | POA: Diagnosis not present

## 2023-11-27 NOTE — Telephone Encounter (Signed)
 Prescription refill request for Eliquis received. Indication: PAF Last office visit: 09/14/23  Dominga Ferry MD Scr: 1.07 on 08/09/23  Epic Age: 83 Weight: 103.1kg  Based on above findings Eliquis 5mg  twice daily is the appropriate dose.  Refill approved.

## 2023-12-14 ENCOUNTER — Encounter: Payer: Self-pay | Admitting: *Deleted

## 2023-12-15 ENCOUNTER — Ambulatory Visit: Payer: Medicare Other | Admitting: Diagnostic Neuroimaging

## 2023-12-15 ENCOUNTER — Encounter: Payer: Self-pay | Admitting: Diagnostic Neuroimaging

## 2023-12-15 VITALS — BP 132/78 | HR 73 | Ht 71.0 in | Wt 223.8 lb

## 2023-12-15 DIAGNOSIS — G250 Essential tremor: Secondary | ICD-10-CM

## 2023-12-15 NOTE — Progress Notes (Signed)
[facility]  PATIENT: Noah Mccarthy DOB: [birth year]  REFERRING CLINICIAN: Estanislado Pandy, MD HISTORY FROM: patient  REASON FOR VISIT: new consult   HISTORICAL  CHIEF COMPLAINT:  Chief Complaint  Patient presents with   Room 7    Pt is here Alone. Pt states that his tremors started 10-12 years ago. Pt states that when he gets up and started moving his tremors will stop. Pt states that he went to the hospital a couple of months ago due to his body doing (a funky dance). Pt states that his balance is a little off recently.     HISTORY OF PRESENT ILLNESS:   83 year old male here for evaluation of tremor.  Patient onset of mild postural action tremor around 83 years old.  Symptoms were mild and have only slightly progressed over time.  In September 2024 patient had a different event where he had more generalized stronger tremors throughout.  He went to the hospital for evaluation.  He was diagnosed with some anxiety state.  Since then he has been on lorazepam and citalopram and he is back to baseline.   REVIEW OF SYSTEMS: Full 14 system review of systems performed and negative with exception of: as per HPI.  ALLERGIES: No Known Allergies  HOME MEDICATIONS: Outpatient Medications Prior to Visit  Medication Sig Dispense Refill   citalopram (CELEXA) 10 MG tablet Take 10 mg by mouth daily.     diltiazem (CARDIZEM) 30 MG tablet Take 1 tablet (30 mg total) by mouth 2 (two) times daily. May take additional 30 mg tablet daily as needed for palpitations. 180 tablet 1   ELIQUIS 5 MG TABS tablet TAKE 1 TABLET BY MOUTH TWICE A DAY 180 tablet 1   LORazepam (ATIVAN) 0.5 MG tablet Take 0.5 mg by mouth daily as needed.     nitroGLYCERIN (NITROSTAT) 0.4 MG SL tablet Place 1 tablet (0.4 mg total) under the tongue every 5 (five) minutes as needed for chest pain. 90 tablet 3   No facility-administered medications prior to visit.    PAST MEDICAL HISTORY: Past Medical  History:  Diagnosis Date   Chest tightness    With rapid atrial fibrillation, June, 2013, stress echo normal   Ejection fraction    EF 60%, echo, June, 2013   Hyperlipidemia    Paroxysmal atrial fibrillation (HCC)    Hospitalization in June, 2013, spontaneous conversion to normal sinus rhythm, CHADS score is 0, therefore anticoagulation has not been recommended   Prostate cancer (HCC)    Sinus tachycardia    Mild, office , December, 2013, no symptoms    PAST SURGICAL HISTORY: Past Surgical History:  Procedure Laterality Date   PROSTATE SURGERY     For prostate cancer    FAMILY HISTORY: Family History  Problem Relation Age of Onset   Heart attack Brother 20       cause of death    SOCIAL HISTORY: Social History   Socioeconomic History   Marital status: Unknown    Spouse name: Not on file   Number of children: Not on file   Years of education: Not on file   Highest education level: Not on file  Occupational History   Not on file  Tobacco Use   Smoking status: Never    Passive exposure: Never   Smokeless tobacco: Never  Vaping Use   Vaping status: Never Used  Substance and Sexual Activity   Alcohol use: Yes    Alcohol/week: 0.0 standard drinks of  alcohol    Comment: 1 beverage for dinner   Drug use: No   Sexual activity: Not on file  Other Topics Concern   Not on file  Social History Narrative   Not on file   Social Drivers of Health   Financial Resource Strain: Not on file  Food Insecurity: Not on file  Transportation Needs: Not on file  Physical Activity: Not on file  Stress: Not on file  Social Connections: Not on file  Intimate Partner Violence: Not on file     PHYSICAL EXAM  GENERAL EXAM/CONSTITUTIONAL: Vitals:  Vitals:   12/15/23 1118  BP: 132/78  Pulse: 73  Weight: 223 lb 12.8 oz (101.5 kg)  Height: 5\' 11"  (1.803 m)   Body mass index is 31.21 kg/m. Wt Readings from Last 3 Encounters:  12/15/23 223 lb 12.8 oz (101.5 kg)  09/14/23  227 lb 6.4 oz (103.1 kg)  03/09/23 234 lb (106.1 kg)   Patient is in no distress; well developed, nourished and groomed; neck is supple  CARDIOVASCULAR: Examination of carotid arteries is normal; no carotid bruits Regular rate and rhythm, no murmurs Examination of peripheral vascular system by observation and palpation is normal  EYES: Ophthalmoscopic exam of optic discs and posterior segments is normal; no papilledema or hemorrhages No results found.  MUSCULOSKELETAL: Gait, strength, tone, movements noted in Neurologic exam below  NEUROLOGIC: MENTAL STATUS:      No data to display         awake, alert, oriented to person, place and time recent and remote memory intact normal attention and concentration language fluent, comprehension intact, naming intact fund of knowledge appropriate  CRANIAL NERVE:  2nd - no papilledema on fundoscopic exam 2nd, 3rd, 4th, 6th - pupils equal and reactive to light, visual fields full to confrontation, extraocular muscles intact, no nystagmus 5th - facial sensation symmetric 7th - facial strength symmetric 8th - hearing intact 9th - palate elevates symmetrically, uvula midline 11th - shoulder shrug symmetric 12th - tongue protrusion midline  MOTOR:  POSTURAL AND ACTION TREMOR IN BUE AND FACE NO RESTING TREMOR; NO RIGIDITY; NO BRADYKINESIA normal bulk and tone, full strength in the BUE, BLE  SENSORY:  normal and symmetric to light touch, temperature, vibration  COORDINATION:  finger-nose-finger, fine finger movements normal  REFLEXES:  deep tendon reflexes TRACE and symmetric  GAIT/STATION:  narrow based gait; SLIGHTLY SLOW     DIAGNOSTIC DATA (LABS, IMAGING, TESTING) - I reviewed patient records, labs, notes, testing and imaging myself where available.  Lab Results  Component Value Date   WBC 6.3 01/24/2018   HGB 15.5 01/24/2018   HCT 46.8 01/24/2018   MCV 95.9 01/24/2018   PLT 214 01/24/2018      Component Value  Date/Time   NA 142 01/24/2018 1128   K 4.3 01/24/2018 1128   CL 107 01/24/2018 1128   CO2 25 01/24/2018 1128   GLUCOSE 97 01/24/2018 1128   BUN 17 01/24/2018 1128   CREATININE 0.83 01/24/2018 1128   CALCIUM 9.2 01/24/2018 1128   GFRNONAA >60 01/24/2018 1128   GFRAA >60 01/24/2018 1128   No results found for: "CHOL", "HDL", "LDLCALC", "LDLDIRECT", "TRIG", "CHOLHDL" No results found for: "HGBA1C" No results found for: "VITAMINB12" No results found for: "TSH"     ASSESSMENT AND PLAN  83 y.o. year old male here with:   Dx:  1. Essential tremor     PLAN:  Essential tremor - mild-moderate symptoms; consider primidone or propranolol in future  Mild anxiety state - continue citalopram and lorazepam per PCP  Return for pending if symptoms worsen or fail to improve, return to PCP.    Suanne Marker, MD 12/15/2023, 11:51 AM Certified in Neurology, Neurophysiology and Neuroimaging  [facility] [address], [address] [place], [place] [address] [phone]

## 2023-12-15 NOTE — Patient Instructions (Signed)
  Essential tremor - mild-moderate symptoms; consider primidone or propranolol in future  Mild anxiety state - continue citalopram and lorazepam per PCP

## 2024-01-22 ENCOUNTER — Encounter: Payer: Self-pay | Admitting: Cardiology

## 2024-02-05 DIAGNOSIS — M79674 Pain in right toe(s): Secondary | ICD-10-CM | POA: Diagnosis not present

## 2024-02-05 DIAGNOSIS — M79675 Pain in left toe(s): Secondary | ICD-10-CM | POA: Diagnosis not present

## 2024-02-05 DIAGNOSIS — L609 Nail disorder, unspecified: Secondary | ICD-10-CM | POA: Diagnosis not present

## 2024-02-05 DIAGNOSIS — M79671 Pain in right foot: Secondary | ICD-10-CM | POA: Diagnosis not present

## 2024-02-05 DIAGNOSIS — M79672 Pain in left foot: Secondary | ICD-10-CM | POA: Diagnosis not present

## 2024-02-05 DIAGNOSIS — I739 Peripheral vascular disease, unspecified: Secondary | ICD-10-CM | POA: Diagnosis not present

## 2024-02-22 DIAGNOSIS — E7800 Pure hypercholesterolemia, unspecified: Secondary | ICD-10-CM | POA: Diagnosis not present

## 2024-02-22 DIAGNOSIS — E7849 Other hyperlipidemia: Secondary | ICD-10-CM | POA: Diagnosis not present

## 2024-02-22 DIAGNOSIS — I10 Essential (primary) hypertension: Secondary | ICD-10-CM | POA: Diagnosis not present

## 2024-02-29 DIAGNOSIS — I10 Essential (primary) hypertension: Secondary | ICD-10-CM | POA: Diagnosis not present

## 2024-02-29 DIAGNOSIS — E782 Mixed hyperlipidemia: Secondary | ICD-10-CM | POA: Diagnosis not present

## 2024-02-29 DIAGNOSIS — I482 Chronic atrial fibrillation, unspecified: Secondary | ICD-10-CM | POA: Diagnosis not present

## 2024-03-06 DIAGNOSIS — Z0000 Encounter for general adult medical examination without abnormal findings: Secondary | ICD-10-CM | POA: Diagnosis not present

## 2024-03-06 DIAGNOSIS — Z0001 Encounter for general adult medical examination with abnormal findings: Secondary | ICD-10-CM | POA: Diagnosis not present

## 2024-03-06 DIAGNOSIS — Z1389 Encounter for screening for other disorder: Secondary | ICD-10-CM | POA: Diagnosis not present

## 2024-03-18 ENCOUNTER — Ambulatory Visit: Admitting: Cardiology

## 2024-03-22 ENCOUNTER — Ambulatory Visit: Admitting: Cardiology

## 2024-04-10 DIAGNOSIS — M25562 Pain in left knee: Secondary | ICD-10-CM | POA: Diagnosis not present

## 2024-04-16 DIAGNOSIS — M79671 Pain in right foot: Secondary | ICD-10-CM | POA: Diagnosis not present

## 2024-04-16 DIAGNOSIS — I739 Peripheral vascular disease, unspecified: Secondary | ICD-10-CM | POA: Diagnosis not present

## 2024-04-16 DIAGNOSIS — M79675 Pain in left toe(s): Secondary | ICD-10-CM | POA: Diagnosis not present

## 2024-04-16 DIAGNOSIS — M79672 Pain in left foot: Secondary | ICD-10-CM | POA: Diagnosis not present

## 2024-04-16 DIAGNOSIS — M79674 Pain in right toe(s): Secondary | ICD-10-CM | POA: Diagnosis not present

## 2024-04-16 DIAGNOSIS — L609 Nail disorder, unspecified: Secondary | ICD-10-CM | POA: Diagnosis not present

## 2024-04-24 DIAGNOSIS — H353132 Nonexudative age-related macular degeneration, bilateral, intermediate dry stage: Secondary | ICD-10-CM | POA: Diagnosis not present

## 2024-05-23 ENCOUNTER — Other Ambulatory Visit: Payer: Self-pay | Admitting: Cardiology

## 2024-05-23 DIAGNOSIS — I480 Paroxysmal atrial fibrillation: Secondary | ICD-10-CM

## 2024-05-23 NOTE — Telephone Encounter (Signed)
 Prescription refill request for Eliquis  received. Indication: Afib  Last office visit: 09/14/23 ([place])  Scr: 1.07 (08/09/23)  Age: 83 Weight: 101.5kg  Appropriate dose. Refill sent.

## 2024-06-10 ENCOUNTER — Ambulatory Visit: Attending: Cardiology | Admitting: Cardiology

## 2024-06-10 ENCOUNTER — Encounter: Payer: Self-pay | Admitting: Cardiology

## 2024-06-10 VITALS — BP 122/76 | HR 84 | Ht 71.0 in | Wt 222.2 lb

## 2024-06-10 DIAGNOSIS — I480 Paroxysmal atrial fibrillation: Secondary | ICD-10-CM

## 2024-06-10 DIAGNOSIS — I10 Essential (primary) hypertension: Secondary | ICD-10-CM | POA: Diagnosis not present

## 2024-06-10 DIAGNOSIS — D6869 Other thrombophilia: Secondary | ICD-10-CM

## 2024-06-10 DIAGNOSIS — R0789 Other chest pain: Secondary | ICD-10-CM | POA: Diagnosis not present

## 2024-06-10 NOTE — Patient Instructions (Signed)
 Medication Instructions:  Continue all current medications.   Labwork: none  Testing/Procedures: none  Follow-Up: 6 months   Any Other Special Instructions Will Be Listed Below (If Applicable).   If you need a refill on your cardiac medications before your next appointment, please call your pharmacy.

## 2024-06-10 NOTE — Progress Notes (Signed)
 Clinical Summary Noah Mccarthy is a 83 y.o.male seen today for follow up of the following medical problems.      1. History of chest pain - 08/2015 exercise nuclear stress showed no ST/T changes, poor exercise tolerance, normal perfusion with no ischemia.   - Jan 2024 nuclear stress no ischemia   - denies any chest pains     2. Afib - lopressor has caused fatigue in the past, changed to diltiazem      - no recent palpitations - compliant with meds - no bleeding on eliquis .      4. HTN - he is compliant with meds     5. Generalized fatigue - naps often.  +snoring, +daytime somnolence.  - has been reluctant to consider sleep study   6. Tremors - ER visit 07/2023 - has pcp f/u, considering seeing neurology   SH: Has a fishing place in [place], just got back from trip Past Medical History:  Diagnosis Date   Chest tightness    With rapid atrial fibrillation, June, 2013, stress echo normal   Ejection fraction    EF 60%, echo, June, 2013   Hyperlipidemia    Paroxysmal atrial fibrillation (HCC)    Hospitalization in June, 2013, spontaneous conversion to normal sinus rhythm, CHADS score is 0, therefore anticoagulation has not been recommended   Prostate cancer (HCC)    Sinus tachycardia    Mild, office , December, 2013, no symptoms     No Known Allergies   Current Outpatient Medications  Medication Sig Dispense Refill   citalopram (CELEXA) 10 MG tablet Take 10 mg by mouth daily.     diltiazem  (CARDIZEM ) 30 MG tablet Take 1 tablet (30 mg total) by mouth 2 (two) times daily. May take additional 30 mg tablet daily as needed for palpitations. 180 tablet 1   ELIQUIS  5 MG TABS tablet TAKE 1 TABLET BY MOUTH TWICE A DAY 180 tablet 1   LORazepam (ATIVAN) 0.5 MG tablet Take 0.5 mg by mouth daily as needed.     nitroGLYCERIN  (NITROSTAT ) 0.4 MG SL tablet Place 1 tablet (0.4 mg total) under the tongue every 5 (five) minutes as needed for chest pain. 90 tablet 3   No  current facility-administered medications for this visit.     Past Surgical History:  Procedure Laterality Date   PROSTATE SURGERY     For prostate cancer     No Known Allergies    Family History  Problem Relation Age of Onset   Heart attack Brother 93       cause of death     Social History Noah Mccarthy reports that he has never smoked. He has never been exposed to tobacco smoke. He has never used smokeless tobacco. Noah Mccarthy reports current alcohol  use.     Physical Examination Today's Vitals   06/10/24 1522  BP: 122/76  Pulse: 84  SpO2: 95%  Weight: 222 lb 3.2 oz (100.8 kg)  Height: 5' 11 (1.803 m)   Body mass index is 30.99 kg/m.  Gen: resting comfortably, no acute distress HEENT: no scleral icterus, pupils equal round and reactive, no palptable cervical adenopathy,  CV: RRR, no m/rg, no jvd Resp: Clear to auscultation bilaterally GI: abdomen is soft, non-tender, non-distended, normal bowel sounds, no hepatosplenomegaly MSK: extremities are warm, no edema.  Skin: warm, no rash Neuro:  no focal deficits Psych: appropriate affect   Diagnostic Studies  08/2015 Exercise MPI Blood pressure demonstrated a hypertensive response to exercise.  There was no ST segment deviation noted during stress. Poor exercise tolerance with Duke treadmill score of 3 portends a moderate risk for cardiac events. Clinical correlation indicated. Normal myocardial perfusion. Nuclear stress EF: 75%.     03/2012 echo LVEF 60%, grade I diastolic dysfunction     Jan 2024 nuclear stress  Findings are consistent with no ischemia. The study is low risk.   No ST deviation was noted. The ECG was negative for ischemia.   LV perfusion is normal.  Diaphragmatic attenuation noted with no large, reversible perfusion defects to indicate ischemia.   Left ventricular function is normal. Nuclear stress EF: 72 %.     Jan 2024 echo 1. Left ventricular ejection fraction, by estimation, is  60 to 65%. The  left ventricle has normal function. Left ventricular endocardial border  not optimally defined to evaluate regional wall motion. Left ventricular  diastolic parameters are consistent  with Grade I diastolic dysfunction (impaired relaxation). The average left  ventricular global longitudinal strain is -25.5 %. The global longitudinal  strain is normal.   2. Right ventricular systolic function is low normal. The right  ventricular size is normal.   3. The mitral valve was not well visualized. No evidence of mitral valve  regurgitation. No evidence of mitral stenosis.   4. The aortic valve is tricuspid. Aortic valve regurgitation is mild. No  aortic stenosis is present.   5. The inferior vena cava is normal in size with greater than 50%  respiratory variability, suggesting right atrial pressure of 3 mmHg.    Assessment and Plan   1. PAF/acquired thrombophilia -no recent symptoms, he will continue current meds including eliquis  for stroke prevention.  - EKG today shows NSR   2. Chest pain - stress testing over the years including Jan 2024 has been benign - no recent symptoms, continue to monintor  3. HTN -at goal, continue current meds  Request pcp labs     Dorn PHEBE Ross, M.D.

## 2024-06-14 ENCOUNTER — Encounter: Payer: Self-pay | Admitting: *Deleted

## 2024-06-14 ENCOUNTER — Encounter: Payer: Self-pay | Admitting: Cardiology

## 2024-06-18 DIAGNOSIS — I482 Chronic atrial fibrillation, unspecified: Secondary | ICD-10-CM | POA: Diagnosis not present

## 2024-06-18 DIAGNOSIS — R251 Tremor, unspecified: Secondary | ICD-10-CM | POA: Diagnosis not present

## 2024-06-26 DIAGNOSIS — M79674 Pain in right toe(s): Secondary | ICD-10-CM | POA: Diagnosis not present

## 2024-06-26 DIAGNOSIS — L609 Nail disorder, unspecified: Secondary | ICD-10-CM | POA: Diagnosis not present

## 2024-06-26 DIAGNOSIS — I739 Peripheral vascular disease, unspecified: Secondary | ICD-10-CM | POA: Diagnosis not present

## 2024-06-26 DIAGNOSIS — M79672 Pain in left foot: Secondary | ICD-10-CM | POA: Diagnosis not present

## 2024-06-26 DIAGNOSIS — M79675 Pain in left toe(s): Secondary | ICD-10-CM | POA: Diagnosis not present

## 2024-06-26 DIAGNOSIS — M79671 Pain in right foot: Secondary | ICD-10-CM | POA: Diagnosis not present

## 2024-08-05 DIAGNOSIS — R350 Frequency of micturition: Secondary | ICD-10-CM | POA: Diagnosis not present

## 2024-08-05 DIAGNOSIS — R251 Tremor, unspecified: Secondary | ICD-10-CM | POA: Diagnosis not present

## 2024-08-20 DIAGNOSIS — L218 Other seborrheic dermatitis: Secondary | ICD-10-CM | POA: Diagnosis not present

## 2024-09-05 DIAGNOSIS — E7849 Other hyperlipidemia: Secondary | ICD-10-CM | POA: Diagnosis not present

## 2024-09-12 DIAGNOSIS — M79672 Pain in left foot: Secondary | ICD-10-CM | POA: Diagnosis not present

## 2024-09-12 DIAGNOSIS — L609 Nail disorder, unspecified: Secondary | ICD-10-CM | POA: Diagnosis not present

## 2024-09-12 DIAGNOSIS — M79671 Pain in right foot: Secondary | ICD-10-CM | POA: Diagnosis not present

## 2024-09-12 DIAGNOSIS — M79674 Pain in right toe(s): Secondary | ICD-10-CM | POA: Diagnosis not present

## 2024-09-12 DIAGNOSIS — I739 Peripheral vascular disease, unspecified: Secondary | ICD-10-CM | POA: Diagnosis not present

## 2024-09-12 DIAGNOSIS — M79675 Pain in left toe(s): Secondary | ICD-10-CM | POA: Diagnosis not present

## 2024-11-16 ENCOUNTER — Other Ambulatory Visit: Payer: Self-pay | Admitting: Cardiology

## 2024-11-16 DIAGNOSIS — I480 Paroxysmal atrial fibrillation: Secondary | ICD-10-CM

## 2024-11-18 NOTE — Telephone Encounter (Signed)
 Eliquis  5mg  refill request received. Patient is 84 years old, weight-100.8kg, Crea-1.0 on 02/22/24 via scanned labs, Diagnosis-Afib, and last seen by Dr. Alvan on 06/10/24. Dose is appropriate based on dosing criteria. Will send in refill to requested pharmacy.
